# Patient Record
Sex: Female | Born: 1970 | Race: Asian | Hispanic: No | Marital: Married | Smoking: Never smoker
Health system: Southern US, Community
[De-identification: ages and names within clinical notes are randomized; demographics above are authoritative.]

## PROBLEM LIST (undated history)

## (undated) ENCOUNTER — Inpatient Hospital Stay (HOSPITAL_COMMUNITY): Payer: Self-pay

## (undated) DIAGNOSIS — F410 Panic disorder [episodic paroxysmal anxiety] without agoraphobia: Secondary | ICD-10-CM

## (undated) DIAGNOSIS — Z3480 Encounter for supervision of other normal pregnancy, unspecified trimester: Secondary | ICD-10-CM

## (undated) DIAGNOSIS — R5383 Other fatigue: Secondary | ICD-10-CM

## (undated) DIAGNOSIS — O24419 Gestational diabetes mellitus in pregnancy, unspecified control: Secondary | ICD-10-CM

## (undated) DIAGNOSIS — Z8739 Personal history of other diseases of the musculoskeletal system and connective tissue: Secondary | ICD-10-CM

## (undated) DIAGNOSIS — I499 Cardiac arrhythmia, unspecified: Secondary | ICD-10-CM

## (undated) DIAGNOSIS — K649 Unspecified hemorrhoids: Secondary | ICD-10-CM

## (undated) DIAGNOSIS — R200 Anesthesia of skin: Secondary | ICD-10-CM

## (undated) DIAGNOSIS — O24414 Gestational diabetes mellitus in pregnancy, insulin controlled: Secondary | ICD-10-CM

## (undated) DIAGNOSIS — G5600 Carpal tunnel syndrome, unspecified upper limb: Secondary | ICD-10-CM

## (undated) DIAGNOSIS — G930 Cerebral cysts: Secondary | ICD-10-CM

## (undated) DIAGNOSIS — F32A Depression, unspecified: Secondary | ICD-10-CM

## (undated) DIAGNOSIS — E7800 Pure hypercholesterolemia, unspecified: Secondary | ICD-10-CM

## (undated) DIAGNOSIS — K642 Third degree hemorrhoids: Secondary | ICD-10-CM

## (undated) DIAGNOSIS — F329 Major depressive disorder, single episode, unspecified: Secondary | ICD-10-CM

## (undated) DIAGNOSIS — I10 Essential (primary) hypertension: Secondary | ICD-10-CM

## (undated) DIAGNOSIS — Z789 Other specified health status: Secondary | ICD-10-CM

## (undated) DIAGNOSIS — J302 Other seasonal allergic rhinitis: Secondary | ICD-10-CM

## (undated) DIAGNOSIS — K219 Gastro-esophageal reflux disease without esophagitis: Secondary | ICD-10-CM

## (undated) DIAGNOSIS — Z8719 Personal history of other diseases of the digestive system: Secondary | ICD-10-CM

## (undated) DIAGNOSIS — Z860100 Personal history of colon polyps, unspecified: Secondary | ICD-10-CM

## (undated) DIAGNOSIS — G43909 Migraine, unspecified, not intractable, without status migrainosus: Secondary | ICD-10-CM

## (undated) DIAGNOSIS — R202 Paresthesia of skin: Secondary | ICD-10-CM

## (undated) DIAGNOSIS — F419 Anxiety disorder, unspecified: Secondary | ICD-10-CM

## (undated) HISTORY — PX: COLONOSCOPY W/ POLYPECTOMY: SHX1380

## (undated) HISTORY — DX: Major depressive disorder, single episode, unspecified: F32.9

## (undated) HISTORY — DX: Carpal tunnel syndrome, unspecified upper limb: G56.00

## (undated) HISTORY — PX: ESSURE TUBAL LIGATION: SUR464

## (undated) HISTORY — DX: Depression, unspecified: F32.A

## (undated) HISTORY — DX: Anxiety disorder, unspecified: F41.9

## (undated) HISTORY — DX: Unspecified hemorrhoids: K64.9

## (undated) HISTORY — DX: Cardiac arrhythmia, unspecified: I49.9

---

## 2002-05-09 ENCOUNTER — Emergency Department (HOSPITAL_COMMUNITY): Admission: EM | Admit: 2002-05-09 | Discharge: 2002-05-09 | Payer: Self-pay | Admitting: Emergency Medicine

## 2002-10-10 ENCOUNTER — Ambulatory Visit (HOSPITAL_COMMUNITY): Admission: RE | Admit: 2002-10-10 | Discharge: 2002-10-10 | Payer: Self-pay | Admitting: Obstetrics and Gynecology

## 2002-10-10 ENCOUNTER — Encounter: Payer: Self-pay | Admitting: Obstetrics and Gynecology

## 2003-02-24 ENCOUNTER — Inpatient Hospital Stay (HOSPITAL_COMMUNITY): Admission: AD | Admit: 2003-02-24 | Discharge: 2003-02-24 | Payer: Self-pay | Admitting: Obstetrics and Gynecology

## 2003-02-24 ENCOUNTER — Inpatient Hospital Stay (HOSPITAL_COMMUNITY): Admission: AD | Admit: 2003-02-24 | Discharge: 2003-02-27 | Payer: Self-pay | Admitting: *Deleted

## 2003-03-09 ENCOUNTER — Encounter: Admission: RE | Admit: 2003-03-09 | Discharge: 2003-04-08 | Payer: Self-pay | Admitting: Obstetrics and Gynecology

## 2003-04-02 ENCOUNTER — Other Ambulatory Visit: Admission: RE | Admit: 2003-04-02 | Discharge: 2003-04-02 | Payer: Self-pay | Admitting: Obstetrics and Gynecology

## 2004-05-21 ENCOUNTER — Other Ambulatory Visit: Admission: RE | Admit: 2004-05-21 | Discharge: 2004-05-21 | Payer: Self-pay | Admitting: Obstetrics and Gynecology

## 2005-04-07 ENCOUNTER — Other Ambulatory Visit: Admission: RE | Admit: 2005-04-07 | Discharge: 2005-04-07 | Payer: Self-pay | Admitting: Obstetrics and Gynecology

## 2005-10-19 ENCOUNTER — Encounter: Admission: RE | Admit: 2005-10-19 | Discharge: 2005-10-19 | Payer: Self-pay | Admitting: Obstetrics and Gynecology

## 2005-12-30 ENCOUNTER — Inpatient Hospital Stay (HOSPITAL_COMMUNITY): Admission: AD | Admit: 2005-12-30 | Discharge: 2005-12-31 | Payer: Self-pay | Admitting: Obstetrics and Gynecology

## 2005-12-31 ENCOUNTER — Inpatient Hospital Stay (HOSPITAL_COMMUNITY): Admission: AD | Admit: 2005-12-31 | Discharge: 2006-01-02 | Payer: Self-pay | Admitting: Obstetrics and Gynecology

## 2005-12-31 ENCOUNTER — Encounter (INDEPENDENT_AMBULATORY_CARE_PROVIDER_SITE_OTHER): Payer: Self-pay | Admitting: *Deleted

## 2006-08-10 ENCOUNTER — Emergency Department (HOSPITAL_COMMUNITY): Admission: EM | Admit: 2006-08-10 | Discharge: 2006-08-10 | Payer: Self-pay | Admitting: Emergency Medicine

## 2007-01-12 ENCOUNTER — Emergency Department (HOSPITAL_COMMUNITY): Admission: EM | Admit: 2007-01-12 | Discharge: 2007-01-12 | Payer: Self-pay | Admitting: Emergency Medicine

## 2007-07-05 ENCOUNTER — Emergency Department (HOSPITAL_COMMUNITY): Admission: EM | Admit: 2007-07-05 | Discharge: 2007-07-05 | Payer: Self-pay | Admitting: Family Medicine

## 2007-09-21 ENCOUNTER — Ambulatory Visit: Payer: Self-pay | Admitting: Family Medicine

## 2008-07-10 ENCOUNTER — Ambulatory Visit (HOSPITAL_COMMUNITY): Admission: RE | Admit: 2008-07-10 | Discharge: 2008-07-10 | Payer: Self-pay | Admitting: Obstetrics and Gynecology

## 2008-07-13 ENCOUNTER — Encounter (INDEPENDENT_AMBULATORY_CARE_PROVIDER_SITE_OTHER): Payer: Self-pay | Admitting: Obstetrics and Gynecology

## 2008-07-13 ENCOUNTER — Ambulatory Visit (HOSPITAL_COMMUNITY): Admission: RE | Admit: 2008-07-13 | Discharge: 2008-07-13 | Payer: Self-pay | Admitting: Obstetrics and Gynecology

## 2008-10-01 ENCOUNTER — Emergency Department (HOSPITAL_COMMUNITY): Admission: EM | Admit: 2008-10-01 | Discharge: 2008-10-01 | Payer: Self-pay | Admitting: Emergency Medicine

## 2008-10-03 ENCOUNTER — Emergency Department (HOSPITAL_COMMUNITY): Admission: EM | Admit: 2008-10-03 | Discharge: 2008-10-03 | Payer: Self-pay | Admitting: Emergency Medicine

## 2008-10-17 ENCOUNTER — Encounter: Payer: Self-pay | Admitting: Family Medicine

## 2008-10-17 ENCOUNTER — Ambulatory Visit: Payer: Self-pay | Admitting: Family Medicine

## 2008-10-17 LAB — CONVERTED CEMR LAB
ALT: 26 units/L (ref 0–35)
Albumin: 4.5 g/dL (ref 3.5–5.2)
Alkaline Phosphatase: 55 units/L (ref 39–117)
BUN: 7 mg/dL (ref 6–23)
Basophils Absolute: 0 10*3/uL (ref 0.0–0.1)
Basophils Relative: 0 % (ref 0–1)
Calcium: 8.8 mg/dL (ref 8.4–10.5)
Cholesterol: 168 mg/dL (ref 0–200)
Eosinophils Absolute: 0.2 10*3/uL (ref 0.0–0.7)
Eosinophils Relative: 2 % (ref 0–5)
Free T4: 1.08 ng/dL (ref 0.80–1.80)
Glucose, Bld: 84 mg/dL (ref 70–99)
HDL: 27 mg/dL — ABNORMAL LOW (ref >39)
Lymphocytes Relative: 35 % (ref 12–46)
Lymphs Abs: 2.9 10*3/uL (ref 0.7–4.0)
Monocytes Absolute: 0.6 10*3/uL (ref 0.1–1.0)
Neutrophils Relative %: 55 % (ref 43–77)
Triglycerides: 679 mg/dL — ABNORMAL HIGH (ref <150)
WBC: 8.1 10*3/uL (ref 4.0–10.5)

## 2008-11-14 ENCOUNTER — Ambulatory Visit: Payer: Self-pay | Admitting: Internal Medicine

## 2009-04-01 ENCOUNTER — Emergency Department (HOSPITAL_COMMUNITY): Admission: EM | Admit: 2009-04-01 | Discharge: 2009-04-01 | Payer: Self-pay | Admitting: Emergency Medicine

## 2010-02-02 NOTE — L&D Delivery Note (Signed)
Delivery Note At 7:15 PM a viable female was delivered via Vaginal, Spontaneous Delivery (Presentation: Left Occiput Anterior).  APGAR: 9, 9; weight 6 lb 5.6 oz (2880 g).   Placenta status: Intact, Spontaneous.  Cord: 3 vessels with the following complications: None.    Anesthesia: Epidural  Episiotomy: none Lacerations: 1st degree perineal Suture Repair: 3.0 vicryl rapide Est. Blood Loss (mL): 300  Mom to postpartum.  Baby to nursery-stable.   [name] [name], B+, Contra?[name] Kitchen No circ  [name] [name],[name] [name] 09/15/2010, 7:35 PM

## 2010-04-23 LAB — COMPREHENSIVE METABOLIC PANEL
Chloride: 108 mEq/L (ref 96–112)
GFR calc Af Amer: >60 mL/min (ref >60)
Glucose, Bld: 91 mg/dL (ref 70–99)
Potassium: 4 mEq/L (ref 3.5–5.1)
Sodium: 138 mEq/L (ref 135–145)
Total Bilirubin: 0.3 mg/dL (ref 0.3–1.2)
Total Protein: 8 g/dL (ref 6.0–8.3)

## 2010-04-23 LAB — PREGNANCY, URINE: Preg Test, Ur: NEGATIVE

## 2010-04-23 LAB — LIPASE, BLOOD: Lipase: 26 U/L (ref 11–59)

## 2010-04-23 LAB — CBC
HCT: 41.9 % (ref 36.0–46.0)
Hemoglobin: 14 g/dL (ref 12.0–15.0)
RDW: 13.3 % (ref 11.5–15.5)
WBC: 8.2 10*3/uL (ref 4.0–10.5)

## 2010-04-23 LAB — URINALYSIS, ROUTINE W REFLEX MICROSCOPIC
Hgb urine dipstick: NEGATIVE
Ketones, ur: NEGATIVE mg/dL
Nitrite: NEGATIVE
Urobilinogen, UA: 0.2 mg/dL (ref 0.0–1.0)

## 2010-04-23 LAB — DIFFERENTIAL
Basophils Relative: 0 % (ref 0–1)
Eosinophils Relative: 0 % (ref 0–5)
Lymphocytes Relative: 22 % (ref 12–46)
Lymphs Abs: 1.8 10*3/uL (ref 0.7–4.0)
Monocytes Absolute: 0.3 10*3/uL (ref 0.1–1.0)
Monocytes Relative: 4 % (ref 3–12)

## 2010-05-10 LAB — CULTURE, BLOOD (ROUTINE X 2)

## 2010-05-10 LAB — DIFFERENTIAL
Basophils Relative: 1 % (ref 0–1)
Eosinophils Absolute: 0 10*3/uL (ref 0.0–0.7)
Eosinophils Relative: 0 % (ref 0–5)
Lymphs Abs: 1.6 10*3/uL (ref 0.7–4.0)
Monocytes Relative: 5 % (ref 3–12)
Neutrophils Relative %: 81 % — ABNORMAL HIGH (ref 43–77)

## 2010-05-10 LAB — D-DIMER, QUANTITATIVE: D-Dimer, Quant: 0.23 ug/mL-FEU (ref 0.00–0.48)

## 2010-05-10 LAB — BASIC METABOLIC PANEL
CO2: 23 mEq/L (ref 19–32)
Chloride: 106 mEq/L (ref 96–112)
GFR calc non Af Amer: >60 mL/min (ref >60)
Potassium: 4.3 mEq/L (ref 3.5–5.1)

## 2010-05-10 LAB — CBC
Hemoglobin: 14.4 g/dL (ref 12.0–15.0)
MCHC: 34.8 g/dL (ref 30.0–36.0)
WBC: 11.3 10*3/uL — ABNORMAL HIGH (ref 4.0–10.5)

## 2010-05-12 LAB — CBC
Platelets: 245 10*3/uL (ref 150–400)
RBC: 4.6 MIL/uL (ref 3.87–5.11)
RDW: 13.4 % (ref 11.5–15.5)

## 2010-06-17 NOTE — Op Note (Signed)
NAME:  [name] [name], [name] [name]                  ACCOUNT NO.:  [id number]   MEDICAL RECORD NO.:  [id number]          PATIENT TYPE:  AMB   LOCATION:  SDC                           FACILITY:  [facility]   PHYSICIAN:  Zenaida Niece, M.D.DATE OF BIRTH:  Aug 03, 1970   DATE OF PROCEDURE:  DATE OF DISCHARGE:  07/13/2008                               OPERATIVE REPORT   PREOPERATIVE DIAGNOSIS:  Missed abortion.   POSTOPERATIVE DIAGNOSIS:  Missed abortion.   PROCEDURE:  Dilation and evacuation.   SURGEON:  Zenaida Niece, MD.   ANESTHESIA:  Monitored anesthesia care and paracervical block.   FINDINGS:  Slightly enlarged uterus and closed cervix.   SPECIMENS:  Products of conception sent for routine pathology.   ESTIMATED BLOOD LOSS:  Minimal.   COMPLICATIONS:  None.   DESCRIPTION OF PROCEDURE:  The patient was taken to the operating room  and placed in the dorsal supine position.  She was given IV sedation and  placed in mobile stirrups.  Perineum was prepped and draped in the usual  sterile fashion and bladder drained with a latex-free catheter.  Graves  speculum was inserted into the vagina and the anterior lip of the cervix  was grasped with a single-tooth tenaculum.  Paracervical block was then  performed with a total of 16 mL of 2% plain lidocaine.  Uterus then  sounded to approximately 10 cm.  Cervix was gradually dilated to a size  29 dilator.  This allowed the passage of a size 9 curved suction  curette.  Suction curettage was performed with return of blood and  products of conception.  Sharp curettage was then performed with a small  curette, revealing good uterine cry in all quadrants and no significant  tissue.  Suction curettage was performed one more time with return of no  significant blood or tissue.  The suction curette was then removed.  The  single-tooth tenaculum was removed and bleeding was controlled with  pressure.  All instruments were then removed from the vagina.  The  patient tolerated the procedure well and was taken to the recovery room  in stable condition.  Counts were correct.      Zenaida Niece, M.D.  Electronically Signed     TDM/MEDQ  D:  07/13/2008  T:  07/14/2008  Job:  [id number]

## 2010-06-20 NOTE — Discharge Summary (Signed)
NAME:  [name] [name], [name] [name]                              ACCOUNT NO.:  [id number]   MEDICAL RECORD NO.:  [id number]                   PATIENT TYPE:  INP   LOCATION:  9123                                 [facility]ILITY:  [facility]   PHYSICIAN:  Zenaida Niece, M.D.             DATE OF BIRTH:  [birth year]   DATE OF ADMISSION:  02/24/2003  DATE OF DISCHARGE:  02/27/2003                                 DISCHARGE SUMMARY   ADMISSION DIAGNOSES:  Intrauterine pregnancy at 38 weeks.   DISCHARGE DIAGNOSIS:  Intrauterine pregnancy at 38 weeks.   PROCEDURE:  On February 25, 2003 she had a spontaneous vaginal delivery.   HISTORY AND PHYSICAL:  This is a 40 year old Asian female gravida 1 para 0  with an EGA of [redacted] weeks who presents with regular contractions.  She was  seen earlier in the day on January 22 to rule out labor and was found to be  1 cm dilated.  On the second presentation in the evening of January 22 she  was 4 cm dilated.  Prenatal care complicated by Candida at 25 weeks, anemia  treated with iron, upper respiratory infection at 27 weeks treated with  Zithromax, size less than dates with an ultrasound on November 3 with  appropriate for gestational age at an estimated fetal weight of 1023 g,  repeat ultrasound on December 7 AGA approximately 34th percentile, and most  recent ultrasound on January 12 with an estimated fetal weight of 2688 g.  She has also had symptoms of carpal tunnel syndrome treated with wrist  splints and vitamin B6.  Prenatal laboratory data:  Blood type is B positive  with a negative antibody screen.  RPR nonreactive.  Rubella immune.  Hepatitis B surface antigen negative.  HIV negative.  Gonorrhea and  chlamydia negative.  One-hour Glucola 109.  Group B strep is negative.  Past  medical history:  Hypercholesterolemia and hypertriglyceridemia.  Allergies:  PENICILLIN.  Physical examination:  She was afebrile with stable vital signs.  Fetal  heart tracing reactive with initially  contractions every 3-5 minutes.  Abdomen was gravid, nontender, with an estimated fetal weight of 6 pounds.  Vaginal exam on admission was 4 cm dilated.   HOSPITAL COURSE:  The patient was admitted and had a protracted course and  was started on Pitocin.  Once she was started on Pitocin she progressed  fairly rapidly.  On my first exam she was 9, complete, and 0 with a vertex  presentation and amniotomy revealed clear fluid.  She then progressed to  complete, pushed well, and early on the morning of January 23 had a vaginal  delivery of a viable female infant with Apgars of 8 and 8 that weighed 6  pounds 15 ounces over a second degree midline episiotomy with local block.  Placenta delivered spontaneously and was intact with a three-vessel cord.  Her second degree episiotomy with partial  third degree extension was  repaired with 2-0 and 3-0 Vicryl with estimated blood loss less than 500 mL.  Postpartum she had no complications.  Predelivery hemoglobin of 14.2,  postdelivery 12.6.  She breast and bottle fed her baby and on the morning of  postpartum day #2 was stable for discharge home.   DISCHARGE INSTRUCTIONS:  1. Regular diet.  2. Pelvic rest.  3. Follow up in 6 weeks.  4. Medications:  Darvocet-N 100 #21 p.o. q.6h. p.r.n. pain and over-the-     counter ibuprofen as needed.  5. She was given our discharge pamphlet.                                               Zenaida Niece, M.D.    TDM/MEDQ  D:  02/27/2003  T:  02/27/2003  Job:  [id number]

## 2010-06-20 NOTE — Discharge Summary (Signed)
NAME:  [name], [name]                  ACCOUNT NO.:  [id number]   MEDICAL RECORD NO.:  [id number]          PATIENT TYPE:  INP   LOCATION:  9146                          [facility]ILITY[facility]   PHYSICIAN:  [name] [name], M.D.DATE OF BIRTH:  [birth year]   DATE OF ADMISSION:  12/31/2005  DATE OF DISCHARGE:  01/02/2006                               DISCHARGE SUMMARY   ADMISSION DIAGNOSIS:  Intrauterine pregnancy at 38 weeks and gestational  diabetes.   DISCHARGE DIAGNOSIS:  Intrauterine pregnancy at 38 weeks and gestational  diabetes and postpartum hemorrhage.   PROCEDURES:  On November 29, she had a spontaneous vaginal delivery.   HISTORY AND PHYSICAL:  This is a 40 year old Asian female gravida 2,  para 1-0-0-1 with an EGA of [redacted] weeks who presents with complaint of  regular contractions.  Evaluation in the office revealed cervix to be 3-  4 cm dilated.  Prenatal care complicated by gestational diabetes that  was fairly well controlled with glyburide.  She also had size less than  dates with normal growth by ultrasound.  She was also treated with  Zithromax in [place] a URI.   PRENATAL LABS:  Blood type is B+ with a negative antibody screen, RPR  nonreactive, rubella immune, hepatitis B surface antigen negative,  gonorrhea and chlamydia negative, 1-hour Glucola 182, 3-hour GTT 88,  212, 219 and 187.   PAST OB HISTORY:  One vaginal delivery at term without complications.   ALLERGIES:  To PENICILLIN which causes a rash.   PHYSICAL EXAM:  VITAL SIGNS:  She was afebrile with stable vital signs.  ABDOMEN:  Abdomen was gravid, soft with a fundal height of 35.5 cm.  Fetal heart tracing was normal.  PELVIC:  Cervix on Dr. Ebony [name] first exam was 3 to 4, 80-90, -1 to 0,  and amniotomy revealed clear fluid.  Glucose on admission was 108.   HOSPITAL COURSE:  The patient was admitted and Dr. Ambrose [name] performed  amniotomy for augmentation.  She progressed to complete, pushed well and  on the  evening of November29 had a vaginal delivery of a viable female  infant with Apgars of 9 and 9, weight 7 pounds 12 ounces.  Placenta was  large and intact and uterus palpated normal.  She had lacerations  between the clitoris and urethra and a second-degree perineal laceration  and these were repaired with 3-0 Vicryl with local block.  Estimated  blood loss was 500 mL.  Postpartum course complicated by postpartum  hemorrhage where she passed approximately 400 mL of clots.  She was  given Hemabate twice and then remained stable.  Pre delivery hemoglobin  was 13.7, 9.7 at the time of her hemorrhage, and 8.6 later that morning.  She had no further significant bleeding and on morning postpartum day #2  was felt to be stable for discharge home.   DISCHARGE INSTRUCTIONS:  Regular diet, pelvic rest, follow-up in 6  weeks.   DISCHARGE MEDICATIONS:  1. Over-the-counter ibuprofen as needed.  2. She is to continue her prenatal vitamins and iron.   She is given our discharge  pamphlet.      [name] [name], M.D.  Electronically Signed     TDM/MEDQ  D:  01/02/2006  T:  01/03/2006  Job:  [id number]

## 2010-07-08 ENCOUNTER — Other Ambulatory Visit (HOSPITAL_COMMUNITY): Payer: Self-pay | Admitting: Obstetrics and Gynecology

## 2010-07-09 ENCOUNTER — Ambulatory Visit (HOSPITAL_COMMUNITY)
Admission: RE | Admit: 2010-07-09 | Discharge: 2010-07-09 | Disposition: A | Discharge status: Home / Self Care | Payer: Medicaid Other | Source: Ambulatory Visit | Attending: Obstetrics and Gynecology | Admitting: Obstetrics and Gynecology

## 2010-07-09 DIAGNOSIS — Z3689 Encounter for other specified antenatal screening: Secondary | ICD-10-CM | POA: Insufficient documentation

## 2010-07-09 DIAGNOSIS — O365990 Maternal care for other known or suspected poor fetal growth, unspecified trimester, not applicable or unspecified: Secondary | ICD-10-CM | POA: Insufficient documentation

## 2010-07-30 ENCOUNTER — Encounter: Payer: Medicaid Other | Attending: Obstetrics and Gynecology

## 2010-07-30 DIAGNOSIS — O99810 Abnormal glucose complicating pregnancy: Secondary | ICD-10-CM | POA: Insufficient documentation

## 2010-07-30 DIAGNOSIS — Z713 Dietary counseling and surveillance: Secondary | ICD-10-CM | POA: Insufficient documentation

## 2010-08-20 ENCOUNTER — Inpatient Hospital Stay (HOSPITAL_COMMUNITY)
Admission: AD | Admit: 2010-08-20 | Discharge: 2010-08-20 | Disposition: A | Discharge status: Home / Self Care | Payer: Medicaid Other | Source: Ambulatory Visit | Attending: Obstetrics and Gynecology | Admitting: Obstetrics and Gynecology

## 2010-08-20 ENCOUNTER — Other Ambulatory Visit (HOSPITAL_COMMUNITY): Payer: Self-pay | Admitting: Obstetrics and Gynecology

## 2010-08-20 ENCOUNTER — Encounter (HOSPITAL_COMMUNITY): Payer: Self-pay | Admitting: Obstetrics and Gynecology

## 2010-08-20 DIAGNOSIS — Z3689 Encounter for other specified antenatal screening: Secondary | ICD-10-CM

## 2010-08-20 NOTE — Progress Notes (Signed)
Pt states she was seen in the office today for reg office visit, the office told her they did not have time to watch baby on the monitor and told her she needed to come to [facility] for NST.

## 2010-08-21 ENCOUNTER — Ambulatory Visit (HOSPITAL_COMMUNITY): Payer: Medicaid Other

## 2010-08-21 ENCOUNTER — Other Ambulatory Visit (HOSPITAL_COMMUNITY): Payer: Self-pay | Admitting: Obstetrics and Gynecology

## 2010-08-21 ENCOUNTER — Ambulatory Visit (HOSPITAL_COMMUNITY)
Admit: 2010-08-21 | Discharge: 2010-08-21 | Disposition: A | Discharge status: Home / Self Care | Payer: Medicaid Other | Attending: Obstetrics and Gynecology | Admitting: Obstetrics and Gynecology

## 2010-08-21 ENCOUNTER — Other Ambulatory Visit (HOSPITAL_COMMUNITY): Payer: Medicaid Other

## 2010-08-21 DIAGNOSIS — Z3689 Encounter for other specified antenatal screening: Secondary | ICD-10-CM

## 2010-08-21 DIAGNOSIS — O99810 Abnormal glucose complicating pregnancy: Secondary | ICD-10-CM | POA: Insufficient documentation

## 2010-08-21 DIAGNOSIS — O365990 Maternal care for other known or suspected poor fetal growth, unspecified trimester, not applicable or unspecified: Secondary | ICD-10-CM | POA: Insufficient documentation

## 2010-08-21 DIAGNOSIS — O09529 Supervision of elderly multigravida, unspecified trimester: Secondary | ICD-10-CM | POA: Insufficient documentation

## 2010-09-10 ENCOUNTER — Inpatient Hospital Stay (HOSPITAL_COMMUNITY)
Admission: AD | Admit: 2010-09-10 | Discharge: 2010-09-10 | Disposition: A | Discharge status: Home / Self Care | Payer: Medicaid Other | Source: Ambulatory Visit | Attending: Obstetrics and Gynecology | Admitting: Obstetrics and Gynecology

## 2010-09-10 ENCOUNTER — Inpatient Hospital Stay (HOSPITAL_COMMUNITY): Payer: Medicaid Other

## 2010-09-10 DIAGNOSIS — O368390 Maternal care for abnormalities of the fetal heart rate or rhythm, unspecified trimester, not applicable or unspecified: Secondary | ICD-10-CM | POA: Insufficient documentation

## 2010-09-10 NOTE — Progress Notes (Signed)
Patient sent by Dr. Ambrose Mantle for outpatient NST only, no efm.

## 2010-09-15 ENCOUNTER — Encounter (HOSPITAL_COMMUNITY): Payer: Self-pay | Admitting: *Deleted

## 2010-09-15 ENCOUNTER — Encounter (HOSPITAL_COMMUNITY): Payer: Self-pay | Admitting: Anesthesiology

## 2010-09-15 ENCOUNTER — Encounter (HOSPITAL_COMMUNITY): Payer: Self-pay | Admitting: Obstetrics and Gynecology

## 2010-09-15 ENCOUNTER — Other Ambulatory Visit: Payer: Self-pay | Admitting: Obstetrics and Gynecology

## 2010-09-15 ENCOUNTER — Inpatient Hospital Stay (HOSPITAL_COMMUNITY): Payer: Medicaid Other | Admitting: Anesthesiology

## 2010-09-15 ENCOUNTER — Inpatient Hospital Stay (HOSPITAL_COMMUNITY)
Admission: AD | Admit: 2010-09-15 | Discharge: 2010-09-17 | DRG: 774 | Disposition: A | Discharge status: Home / Self Care | Payer: Medicaid Other | Source: Ambulatory Visit | Attending: Obstetrics and Gynecology | Admitting: Obstetrics and Gynecology

## 2010-09-15 DIAGNOSIS — IMO0002 Reserved for concepts with insufficient information to code with codable children: Principal | ICD-10-CM | POA: Diagnosis present

## 2010-09-15 DIAGNOSIS — O99814 Abnormal glucose complicating childbirth: Secondary | ICD-10-CM | POA: Diagnosis present

## 2010-09-15 DIAGNOSIS — Z3480 Encounter for supervision of other normal pregnancy, unspecified trimester: Secondary | ICD-10-CM

## 2010-09-15 DIAGNOSIS — O09529 Supervision of elderly multigravida, unspecified trimester: Secondary | ICD-10-CM | POA: Diagnosis present

## 2010-09-15 DIAGNOSIS — O1490 Unspecified pre-eclampsia, unspecified trimester: Secondary | ICD-10-CM

## 2010-09-15 HISTORY — DX: Encounter for supervision of other normal pregnancy, unspecified trimester: Z34.80

## 2010-09-15 HISTORY — DX: Other specified health status: Z78.9

## 2010-09-15 HISTORY — DX: Pure hypercholesterolemia, unspecified: E78.00

## 2010-09-15 LAB — URIC ACID: Uric Acid, Serum: 7.1 mg/dL — ABNORMAL HIGH (ref 2.4–7.0)

## 2010-09-15 LAB — COMPREHENSIVE METABOLIC PANEL
ALT: 15 U/L (ref 0–35)
AST: 22 U/L (ref 0–37)
Calcium: 8.7 mg/dL (ref 8.4–10.5)
Sodium: 137 mEq/L (ref 135–145)
Total Protein: 6.2 g/dL (ref 6.0–8.3)

## 2010-09-15 LAB — GLUCOSE, CAPILLARY
Glucose-Capillary: 88 mg/dL (ref 70–99)
Glucose-Capillary: 68 mg/dL — ABNORMAL LOW (ref 70–99)
Glucose-Capillary: 81 mg/dL (ref 70–99)

## 2010-09-15 LAB — CBC
HCT: 40.2 % (ref 36.0–46.0)
Hemoglobin: 13.4 g/dL (ref 12.0–15.0)
MCH: 30.1 pg (ref 26.0–34.0)
MCH: 29.8 pg (ref 26.0–34.0)
MCHC: 33.3 g/dL (ref 30.0–36.0)
MCHC: 33.3 g/dL (ref 30.0–36.0)
MCV: 89.3 fL (ref 78.0–100.0)
Platelets: 185 10*3/uL (ref 150–400)
Platelets: 184 K/uL (ref 150–400)
RBC: 4.55 MIL/uL (ref 3.87–5.11)
RBC: 4.5 MIL/uL (ref 3.87–5.11)
RDW: 14.2 % (ref 11.5–15.5)
WBC: 22.4 K/uL — ABNORMAL HIGH (ref 4.0–10.5)

## 2010-09-15 LAB — RUBELLA ANTIBODY, IGM: Rubella: IMMUNE

## 2010-09-15 LAB — URINALYSIS, DIPSTICK ONLY
Bilirubin Urine: NEGATIVE
Glucose, UA: NEGATIVE mg/dL
Ketones, ur: NEGATIVE mg/dL
Leukocytes, UA: NEGATIVE
Nitrite: NEGATIVE
Protein, ur: 100 mg/dL — AB
Specific Gravity, Urine: 1.025 (ref 1.005–1.030)
Urobilinogen, UA: 0.2 mg/dL (ref 0.0–1.0)
pH: 6 (ref 5.0–8.0)

## 2010-09-15 LAB — HEPATITIS B SURFACE ANTIGEN: Hepatitis B Surface Ag: NEGATIVE

## 2010-09-15 LAB — STREP B DNA PROBE: GBS: POSITIVE

## 2010-09-15 LAB — RPR: RPR: NONREACTIVE

## 2010-09-15 LAB — HIV ANTIBODY (ROUTINE TESTING W REFLEX): HIV: NONREACTIVE

## 2010-09-15 MED ORDER — EPHEDRINE 5 MG/ML INJ: 10.0000 mg | INTRAVENOUS | Status: DC | PRN

## 2010-09-15 MED ORDER — MAGNESIUM SULFATE BOLUS VIA INFUSION
4.0000 g | Freq: Once | INTRAVENOUS | Status: AC
  Administered 2010-09-15: 4 g via INTRAVENOUS
  Filled 2010-09-15: qty 500

## 2010-09-15 MED ORDER — MAGNESIUM SULFATE 40 G IN LACTATED RINGERS - SIMPLE
2.0000 g/h | INTRAVENOUS | Status: DC
  Administered 2010-09-15: 4 g/h via INTRAVENOUS
  Administered 2010-09-16: 2 g/h via INTRAVENOUS
  Filled 2010-09-15 (×2): qty 500

## 2010-09-15 MED ORDER — IBUPROFEN 600 MG PO TABS
600.0000 mg | ORAL_TABLET | Freq: Four times a day (QID) | ORAL | Status: DC
  Administered 2010-09-16 – 2010-09-17 (×3): 600 mg via ORAL
  Filled 2010-09-15 (×4): qty 1

## 2010-09-15 MED ORDER — SIMETHICONE 80 MG PO CHEW
80.0000 mg | CHEWABLE_TABLET | ORAL | Status: DC | PRN
  Administered 2010-09-16: 80 mg via ORAL

## 2010-09-15 MED ORDER — DIPHENHYDRAMINE HCL 50 MG/ML IJ SOLN: 12.5000 mg | INTRAMUSCULAR | Status: DC | PRN

## 2010-09-15 MED ORDER — CITRIC ACID-SODIUM CITRATE 334-500 MG/5ML PO SOLN: 30.0000 mL | ORAL | Status: DC | PRN

## 2010-09-15 MED ORDER — EPHEDRINE 5 MG/ML INJ
10.0000 mg | INTRAVENOUS | Status: DC | PRN
  Filled 2010-09-15: qty 4

## 2010-09-15 MED ORDER — ONDANSETRON HCL 4 MG PO TABS: 4.0000 mg | ORAL_TABLET | ORAL | Status: DC | PRN

## 2010-09-15 MED ORDER — FENTANYL 2.5 MCG/ML BUPIVACAINE 1/10 % EPIDURAL INFUSION (WH - ANES)
14.0000 mL/h | INTRAMUSCULAR | Status: DC
  Filled 2010-09-15: qty 60

## 2010-09-15 MED ORDER — LACTATED RINGERS IV SOLN
500.0000 mL | Freq: Once | INTRAVENOUS | Status: AC
  Administered 2010-09-15: 1000 mL via INTRAVENOUS

## 2010-09-15 MED ORDER — LIDOCAINE HCL 1.5 % IJ SOLN
INTRAMUSCULAR | Status: DC | PRN
  Administered 2010-09-15 (×2): 5 mL via EPIDURAL

## 2010-09-15 MED ORDER — CEFAZOLIN SODIUM-DEXTROSE 2-3 GM-% IV SOLR
2.0000 g | Freq: Once | INTRAVENOUS | Status: AC
  Administered 2010-09-15: 2 g via INTRAVENOUS
  Filled 2010-09-15: qty 50

## 2010-09-15 MED ORDER — OXYTOCIN 20 UNITS IN LACTATED RINGERS INFUSION - SIMPLE
125.0000 mL/h | INTRAVENOUS | Status: DC | PRN
  Filled 2010-09-15: qty 1000

## 2010-09-15 MED ORDER — ZOLPIDEM TARTRATE 5 MG PO TABS: 5.0000 mg | ORAL_TABLET | Freq: Every evening | ORAL | Status: DC | PRN

## 2010-09-15 MED ORDER — LANOLIN HYDROUS EX OINT: TOPICAL_OINTMENT | CUTANEOUS | Status: DC | PRN

## 2010-09-15 MED ORDER — IBUPROFEN 600 MG PO TABS: 600.0000 mg | ORAL_TABLET | Freq: Four times a day (QID) | ORAL | Status: DC | PRN

## 2010-09-15 MED ORDER — DEXTROSE IN LACTATED RINGERS 5 % IV SOLN
INTRAVENOUS | Status: DC
  Administered 2010-09-15: 125 mL/h via INTRAVENOUS

## 2010-09-15 MED ORDER — DIPHENHYDRAMINE HCL 25 MG PO CAPS: 25.0000 mg | ORAL_CAPSULE | Freq: Four times a day (QID) | ORAL | Status: DC | PRN

## 2010-09-15 MED ORDER — SODIUM CHLORIDE 0.9 % IV SOLN: 250.0000 mL | INTRAVENOUS | Status: DC

## 2010-09-15 MED ORDER — PHENYLEPHRINE 40 MCG/ML (10ML) SYRINGE FOR IV PUSH (FOR BLOOD PRESSURE SUPPORT)
80.0000 ug | PREFILLED_SYRINGE | INTRAVENOUS | Status: DC | PRN
  Filled 2010-09-15: qty 5

## 2010-09-15 MED ORDER — OXYCODONE-ACETAMINOPHEN 5-325 MG PO TABS: 2.0000 | ORAL_TABLET | ORAL | Status: DC | PRN

## 2010-09-15 MED ORDER — OXYTOCIN 20 UNITS IN LACTATED RINGERS INFUSION - SIMPLE
1.0000 m[IU]/min | INTRAVENOUS | Status: DC
  Administered 2010-09-15: 1 m[IU]/min via INTRAVENOUS
  Filled 2010-09-15: qty 1000

## 2010-09-15 MED ORDER — LACTATED RINGERS IV SOLN
INTRAVENOUS | Status: DC
  Administered 2010-09-16 (×2): via INTRAVENOUS

## 2010-09-15 MED ORDER — CEFAZOLIN SODIUM 1-5 GM-% IV SOLN
1.0000 g | Freq: Three times a day (TID) | INTRAVENOUS | Status: DC
  Filled 2010-09-15: qty 50

## 2010-09-15 MED ORDER — MAGNESIUM SULFATE 40 G IN LACTATED RINGERS - SIMPLE
2.0000 g/h | INTRAVENOUS | Status: DC
  Filled 2010-09-15: qty 500

## 2010-09-15 MED ORDER — DIBUCAINE 1 % RE OINT
1.0000 "application " | TOPICAL_OINTMENT | RECTAL | Status: DC | PRN
  Filled 2010-09-15: qty 28

## 2010-09-15 MED ORDER — SODIUM CHLORIDE 0.9 % IJ SOLN: 3.0000 mL | Freq: Two times a day (BID) | INTRAMUSCULAR | Status: DC

## 2010-09-15 MED ORDER — BENZOCAINE-MENTHOL 20-0.5 % EX AERO
1.0000 "application " | INHALATION_SPRAY | CUTANEOUS | Status: DC | PRN
  Administered 2010-09-16: 1 via TOPICAL
  Filled 2010-09-15 (×2): qty 56

## 2010-09-15 MED ORDER — ACETAMINOPHEN 325 MG PO TABS
650.0000 mg | ORAL_TABLET | ORAL | Status: DC | PRN
  Administered 2010-09-15: 650 mg via ORAL
  Filled 2010-09-15: qty 2

## 2010-09-15 MED ORDER — PRENATAL PLUS 27-1 MG PO TABS
1.0000 | ORAL_TABLET | Freq: Every day | ORAL | Status: DC
  Administered 2010-09-16 – 2010-09-17 (×2): 1 via ORAL
  Filled 2010-09-15 (×2): qty 1

## 2010-09-15 MED ORDER — WITCH HAZEL-GLYCERIN EX PADS: 1.0000 "application " | MEDICATED_PAD | CUTANEOUS | Status: DC | PRN

## 2010-09-15 MED ORDER — OXYTOCIN BOLUS FROM INFUSION
500.0000 mL | Freq: Once | INTRAVENOUS | Status: DC
  Filled 2010-09-15: qty 500

## 2010-09-15 MED ORDER — SODIUM CHLORIDE 0.9 % IJ SOLN
3.0000 mL | INTRAMUSCULAR | Status: DC | PRN
  Administered 2010-09-16: 3 mL via INTRAVENOUS

## 2010-09-15 MED ORDER — PRENATAL PLUS 27-1 MG PO TABS: 1.0000 | ORAL_TABLET | Freq: Every day | ORAL | Status: DC

## 2010-09-15 MED ORDER — TERBUTALINE SULFATE 1 MG/ML IJ SOLN: 0.2500 mg | Freq: Once | INTRAMUSCULAR | Status: DC | PRN

## 2010-09-15 MED ORDER — BUTORPHANOL TARTRATE 2 MG/ML IJ SOLN
INTRAMUSCULAR | Status: AC
  Filled 2010-09-15: qty 1

## 2010-09-15 MED ORDER — FENTANYL 2.5 MCG/ML BUPIVACAINE 1/10 % EPIDURAL INFUSION (WH - ANES)
INTRAMUSCULAR | Status: DC | PRN
  Administered 2010-09-15: 14 mL/h via EPIDURAL

## 2010-09-15 MED ORDER — LIDOCAINE HCL (PF) 1 % IJ SOLN: 30.0000 mL | INTRAMUSCULAR | Status: DC | PRN

## 2010-09-15 MED ORDER — ONDANSETRON HCL 4 MG/2ML IJ SOLN: 4.0000 mg | Freq: Four times a day (QID) | INTRAMUSCULAR | Status: DC | PRN

## 2010-09-15 MED ORDER — ONDANSETRON HCL 4 MG/2ML IJ SOLN
4.0000 mg | INTRAMUSCULAR | Status: DC | PRN
  Administered 2010-09-15: 4 mg via INTRAVENOUS
  Filled 2010-09-15: qty 2

## 2010-09-15 MED ORDER — SENNOSIDES-DOCUSATE SODIUM 8.6-50 MG PO TABS
2.0000 | ORAL_TABLET | Freq: Every day | ORAL | Status: DC
  Administered 2010-09-16: 2 via ORAL

## 2010-09-15 MED ORDER — OXYCODONE-ACETAMINOPHEN 5-325 MG PO TABS: 1.0000 | ORAL_TABLET | ORAL | Status: DC | PRN

## 2010-09-15 MED ORDER — OXYTOCIN 20 UNITS IN LACTATED RINGERS INFUSION - SIMPLE: 125.0000 mL/h | INTRAVENOUS | Status: DC

## 2010-09-15 MED ORDER — LACTATED RINGERS IV SOLN
INTRAVENOUS | Status: DC
  Administered 2010-09-15: 125 mL/h via INTRAVENOUS

## 2010-09-15 MED ORDER — BUTORPHANOL TARTRATE 2 MG/ML IJ SOLN: 2.0000 mg | INTRAMUSCULAR | Status: DC | PRN

## 2010-09-15 MED ORDER — TETANUS-DIPHTH-ACELL PERTUSSIS 5-2.5-18.5 LF-MCG/0.5 IM SUSP
0.5000 mL | Freq: Once | INTRAMUSCULAR | Status: AC
  Administered 2010-09-16: 0.5 mL via INTRAMUSCULAR
  Filled 2010-09-15: qty 0.5

## 2010-09-15 MED ORDER — LACTATED RINGERS IV SOLN: 500.0000 mL | INTRAVENOUS | Status: DC | PRN

## 2010-09-15 MED ORDER — PHENYLEPHRINE 40 MCG/ML (10ML) SYRINGE FOR IV PUSH (FOR BLOOD PRESSURE SUPPORT): 80.0000 ug | PREFILLED_SYRINGE | INTRAVENOUS | Status: DC | PRN

## 2010-09-15 NOTE — H&P (Signed)
[name], [name]                  ACCOUNT NO.:  [id number]  MEDICAL RECORD NO.:  [id number]  LOCATION:  9175                          [facility]ILITY:  [facility]  PHYSICIAN:  Zenaida Niece, M.D.DATE OF BIRTH:  Aug 17, 1970  DATE OF ADMISSION:  09/15/2010 DATE OF DISCHARGE:                             HISTORY & PHYSICAL   CHIEF COMPLAINT:  Gestational hypertension and gestational diabetes and possible early labor.  HISTORY OF PRESENT ILLNESS:  This is a 40 year old gravida 4, para 2-0-1- 1 with an EGA of [redacted] weeks and 4 days by an LMP consistent with early ultrasound with a due date of August 23rd who presents to the office today for routine exam.  She has recently had elevated blood pressures 150-160 over 90-100.  She brought a 24-hour urine in today.  On evaluation today, she has 3+ proteinuria and blood pressure is 160/108. She is having some contractions and cervix is 150 and -2.  Due to this elevated blood pressure and her advanced gestational age I do not see any benefit in prolong in a pregnancy, so she is being admitted for labor augmentation as she is having some contractions.  Prenatal care has been complicated by gestational diabetes which had been controlled with diet, but has recently been controlled with 2.5 mg of glyburide in the morning.  She also had anxiety treated with Prozac due to the fact that her last child died at 24 months of age with asphyxia.  Please see prenatal records for full history.  Prenatal care, she has measured small but ultrasounds have confirmed appropriate for gestational age the size of normal amniotic fluid volume.  Prenatal labs:  Significant labs, blood type is B+ with a negative antibody screen.  Rubella immune, hepatitis B surface antigen negative, HIV negative.  Cystic fibrosis is negative.  Genetic screening was declined.  GC T was 177, GTT was 83, 242, 190 and 170.  Group B strep is positive.  PAST OB HISTORY:  In 2005, she had a vaginal  delivery at 38 weeks.  Baby weighed 6 pounds 15 ounces.  No complications.  In 2007, vaginal delivery at 38 weeks, 7 pounds 12 ounces complicated by gestational diabetes and she did have postpartum hemorrhage.  The baby died 6-9 months after delivery with asphyxia.  She does have one spontaneous abortion in 2010.  PAST MEDICAL HISTORY:  Anxiety, depression and high cholesterol and elevated triglycerides.  PAST SURGICAL HISTORY:  D and C.  ALLERGIES:  She gets a rash with PENICILLIN.  MEDICATIONS:  She has been on Prozac.  Glyburide 2.5 mg p.o. q.a.m.  FAMILY HISTORY:  No GYN or colon cancer.  SOCIAL HISTORY:  She is single but involved in a stable relationship. She denies alcohol, tobacco or drug use.  REVIEW OF SYSTEMS:  Normal complaints of pregnancy.  PHYSICAL EXAM:  GENERAL:  This is a well-developed female in no acute distress. VITAL SIGNS:  Blood pressure again is 160/108. NECK:  Supple without lymphadenopathy or thyromegaly. LUNGS:  Clear to auscultation. HEART:  Regular rate and rhythm without murmur. ABDOMEN:  Gravid, nontender.  She does measure on the small side. EXTREMITIES:  Have 1+ edema and  are nontender.  Cervix is 150, -2, vertex presentation.  ASSESSMENT:  Intrauterine pregnancy at 38+ weeks with gestational hypertension and gestational diabetes as well as possible early labor. PIH labs have been normal, but blood pressures remain elevated.  Blood sugars have been well controlled on glyburide 2.5 mg p.o. q.a.m.  PLAN:  Admit the patient for augmentation with Pitocin.  She also be started on Ancef for group B strep prophylaxis.  We will check PIH labs and monitor her blood pressure carefully.  We will monitor CBGs to see if she needs any treatment for her sugars.     Zenaida Niece, M.D.     TDM/MEDQ  D:  09/15/2010  T:  09/15/2010  Job:  [id number]

## 2010-09-15 NOTE — Progress Notes (Signed)
Provider asked if epidural catheter can be removed, cbc to be done first

## 2010-09-15 NOTE — Progress Notes (Signed)
[name] [name] is a 40 y.o. O1H0865 at [redacted]w[redacted]d  admitted for induction of labor due to Hypertension.  Subjective: comf with epidural  Objective: BP 164/84  Pulse 83  Temp(Src) 98.3 F (36.8 C) (Oral)  Resp 13  Ht 5' (1.524 m)  Wt 61.689 kg (136 lb)  BMI 26.56 kg/m2  BP 150-195/85-110, since epidural 160/90   gen NAD FHT:  FHR: 150 bpm, variability: minimal ,  accelerations:  Present with scalp stim,  decelerations:  Present variables UC:   regular, every 3 minutes SVE:   Dilation:5.6  Effacement (%): 90 Station: 0 Exam by:: Carmelina Noun, MD AROM for clear fluid, copious. W/o diff/comp  Labs: Lab Results  Component Value Date   WBC 8.4 09/15/2010   HGB 13.7 09/15/2010   HCT 41.2 09/15/2010   MCV 90.5 09/15/2010   PLT 185 09/15/2010    Assessment / Plan: Induction of labor due to gestational hypertension,  progressing well on pitocin  Labor: Progressing normally, AROM Preeclampsia:  will monitor closely for sx's, labs stable, no symptoms; t/c magnesium sulfate Fetal Wellbeing:  Category II Pain Control:  Epidural  Anticipated MOD:  NSVD  BOVARD,[name] [name] 09/15/2010, 5:40 PM

## 2010-09-15 NOTE — Anesthesia Preprocedure Evaluation (Signed)
Anesthesia Evaluation  Name, MR# and DOB Patient awake  General Assessment Comment  Reviewed: Allergy & Precautions, H&P , Patient's Chart, lab work & pertinent test results  Airway Mallampati: II TM Distance: >3 FB Neck ROM: full    Dental No notable dental hx.    Pulmonary  clear to auscultation  pulmonary exam normalPulmonary Exam Normal breath sounds clear to auscultation none    Cardiovascular hypertension, regular Normal    Neuro/Psych Negative Neurological ROS  Negative Psych ROS  GI/Hepatic/Renal negative GI ROS, negative Liver ROS, and negative Renal ROS (+)       Endo/Other  (+) Gestational,     Abdominal   Musculoskeletal   Hematology negative hematology ROS (+)   Peds  Reproductive/Obstetrics (+) Pregnancy    Anesthesia Other Findings             Anesthesia Physical Anesthesia Plan  ASA: II  Anesthesia Plan: Epidural   Post-op Pain Management:    Induction:   Airway Management Planned:   Additional Equipment:   Intra-op Plan:   Post-operative Plan:   Informed Consent: I have reviewed the patients History and Physical, chart, labs and discussed the procedure including the risks, benefits and alternatives for the proposed anesthesia with the patient or authorized representative who has indicated his/her understanding and acceptance.     Plan Discussed with:   Anesthesia Plan Comments:         Anesthesia Quick Evaluation

## 2010-09-15 NOTE — Progress Notes (Signed)
09/15/10 1928  Infant Feeding  Breastfeeding delayed due to: Other (comment)  importance of early breastfeeding explained to pt.

## 2010-09-15 NOTE — Anesthesia Postprocedure Evaluation (Signed)
Anesthesia Post Note  Patient: [name] [name]  Procedure(s) Performed: * No procedures listed *  Anesthesia type: Epidural  Patient location: Mother/Baby  Post pain: Pain level controlled  Post assessment: Post-op Vital signs reviewed  Last Vitals:  Filed Vitals:   09/15/10 2016  BP: 169/93  Pulse: 113  Temp:   Resp:     Post vital signs: Reviewed  Level of consciousness: awake  Complications: No apparent anesthesia complications

## 2010-09-15 NOTE — Anesthesia Procedure Notes (Signed)
Epidural Patient location during procedure: OB Start time: 09/15/2010 4:43 PM End time: 09/15/2010 4:50 PM Reason for block: procedure for pain  Staffing Anesthesiologist: Sandrea Hughs Performed by: anesthesiologist   Preanesthetic Checklist Completed: patient identified, site marked, surgical consent, pre-op evaluation, timeout performed, IV checked, risks and benefits discussed and monitors and equipment checked  Epidural Patient position: sitting Prep: site prepped and draped and DuraPrep Patient monitoring: continuous pulse ox and blood pressure Approach: midline Injection technique: LOR air  Needle:  Needle type: Tuohy  Needle gauge: 17 G Needle length: 9 cm Needle insertion depth: 5 cm cm Catheter type: closed end flexible Catheter size: 19 Gauge Catheter at skin depth: 10 cm Test dose: negative and 1.5% lidocaine  Assessment Sensory level: T8 Events: blood not aspirated, injection not painful, no injection resistance, negative IV test and no paresthesia

## 2010-09-15 NOTE — Progress Notes (Signed)
09/15/10 2256  Provider Notification  Provider Name/Title Ellison Hughs, MD  Method of Notification Phone  Notification Reason Lab/diagnostic study results  Provider ok to DC epidural catheter

## 2010-09-16 DIAGNOSIS — O1490 Unspecified pre-eclampsia, unspecified trimester: Secondary | ICD-10-CM

## 2010-09-16 LAB — CBC
HCT: 35.1 % — ABNORMAL LOW (ref 36.0–46.0)
Hemoglobin: 11.7 g/dL — ABNORMAL LOW (ref 12.0–15.0)
MCV: 89.5 fL (ref 78.0–100.0)
RBC: 3.92 MIL/uL (ref 3.87–5.11)
WBC: 21.6 10*3/uL — ABNORMAL HIGH (ref 4.0–10.5)

## 2010-09-16 LAB — URIC ACID: Uric Acid, Serum: 7.4 mg/dL — ABNORMAL HIGH (ref 2.4–7.0)

## 2010-09-16 LAB — COMPREHENSIVE METABOLIC PANEL
Albumin: 2.1 g/dL — ABNORMAL LOW (ref 3.5–5.2)
Alkaline Phosphatase: 134 U/L — ABNORMAL HIGH (ref 39–117)
BUN: 11 mg/dL (ref 6–23)
CO2: 23 mEq/L (ref 19–32)
Chloride: 99 mEq/L (ref 96–112)
Creatinine, Ser: 0.65 mg/dL (ref 0.50–1.10)
GFR calc Af Amer: >60 mL/min (ref >60)
GFR calc non Af Amer: >60 mL/min (ref >60)
Glucose, Bld: 84 mg/dL (ref 70–99)
Potassium: 4.4 mEq/L (ref 3.5–5.1)
Total Bilirubin: 0.2 mg/dL — ABNORMAL LOW (ref 0.3–1.2)

## 2010-09-16 LAB — LACTATE DEHYDROGENASE: LDH: 239 U/L (ref 94–250)

## 2010-09-16 LAB — GLUCOSE, CAPILLARY: Glucose-Capillary: 125 mg/dL — ABNORMAL HIGH (ref 70–99)

## 2010-09-16 MED ORDER — PANTOPRAZOLE SODIUM 40 MG PO TBEC
40.0000 mg | DELAYED_RELEASE_TABLET | Freq: Every day | ORAL | Status: DC
  Administered 2010-09-16: 40 mg via ORAL
  Filled 2010-09-16 (×2): qty 1

## 2010-09-16 NOTE — Progress Notes (Signed)
Transferred via wheelchair to [facility]  [facility].  Ambulating well, steady gait.  No c/o pain  on transfer.  Acc by family.

## 2010-09-16 NOTE — Progress Notes (Deleted)
Post Partum Day 1 Subjective: no complaints, up ad lib, voiding, tolerating PO and currently breast feeding.   Objective: Blood pressure 144/90, pulse 77, temperature 97.5 F (36.4 C), temperature source Oral, resp. rate 18, height 5' (1.524 m), weight 61.689 kg (136 lb), unknown if currently breastfeeding.  Physical Exam:  General: alert, cooperative and no distress Lochia: appropriate Uterine Fundus: firm, below umbilicus Abd: Soft, NT, +BS Cardio: RRR, no murmurs Lungs: CTA, no wheezes or crackles DTR: +2 DVT Evaluation: No evidence of DVT seen on physical exam. Negative Homan's sign. No cords or calf tenderness.   Basename 09/16/10 0500 09/15/10 2235  HGB 11.7* 13.4  HCT 35.1* 40.2    Assessment/Plan: Breastfeeding and Contraception Condoms. Continue care, consider discontinuing Mag at approximately 1900.   LOS: 1 day   Katie Durland 09/16/2010, 7:27 AM

## 2010-09-16 NOTE — Progress Notes (Signed)
PPD#1 Feels ok, having reflux Afeb, BP sl labile, 110-160/70-100 Good diuresis Will d/c Magnesium, Omeprazole for reflux

## 2010-09-16 NOTE — Progress Notes (Signed)
Pt c/o of pain at IV site. No sign of infiltration noted. Will keep an eye on it

## 2010-09-16 NOTE — Addendum Note (Signed)
Addendum  created 09/16/10 1801 by Fanny Dance   Modules edited:Charges VN, Notes Section

## 2010-09-16 NOTE — Progress Notes (Signed)
Post Partum Day 1 Subjective: no complaints, up ad lib, tolerating PO and nl lochia, pain controlled  Objective: Blood pressure 143/88, pulse 88, temperature 98.2 F (36.8 C), temperature source Oral, resp. rate 16, height 5' (1.524 m), weight 57.97 kg (127 lb 12.8 oz), unknown if currently breastfeeding.  Physical Exam:  General: alert and no distress Lochia: appropriate Uterine Fundus: firm  DVT Evaluation: No evidence of DVT seen on physical exam.   Basename 09/16/10 0500 09/15/10 2235  HGB 11.7* 13.4  HCT 35.1* 40.2    Assessment/Plan: Plan for discharge tomorrow.  Magnesium until this evening, transfer to [facility].  PIH labs in AM, doing well   LOS: 1 day   BOVARD,Samir Ishaq 09/16/2010, 8:48 AM

## 2010-09-16 NOTE — Progress Notes (Signed)
UR chart review completed.  

## 2010-09-16 NOTE — Progress Notes (Addendum)
Dr. Jackelyn Knife notified re inc BP (175/96 at 2206)  prior to transfer - no orders received - will reevaluate in AM.  Pt denies denies HA/VD/EP on transfer.  Reflexes WNL/no clonus, minimal edema noted.

## 2010-09-16 NOTE — Anesthesia Postprocedure Evaluation (Signed)
  Anesthesia Post-op Note  Patient: [name] [name]  Procedure(s) Performed: * No procedures listed *  Patient Location: ICU  Anesthesia Type: Epidural  Level of Consciousness: awake, alert  and oriented  Airway and Oxygen Therapy: Patient Spontanous Breathing  Post-op Pain: mild  Post-op Assessment: Patient's Cardiovascular Status Stable and Respiratory Function Stable  Post-op Vital Signs: Reviewed  Complications: No apparent anesthesia complications

## 2010-09-16 NOTE — Progress Notes (Signed)
Pt still c/o of pain at IV site. Offered to change site. Pt refused. Will continue to monitor

## 2010-09-17 LAB — COMPREHENSIVE METABOLIC PANEL
Alkaline Phosphatase: 119 U/L — ABNORMAL HIGH (ref 39–117)
BUN: 12 mg/dL (ref 6–23)
Creatinine, Ser: 0.47 mg/dL — ABNORMAL LOW (ref 0.50–1.10)
Glucose, Bld: 64 mg/dL — ABNORMAL LOW (ref 70–99)
Potassium: 3.3 mEq/L — ABNORMAL LOW (ref 3.5–5.1)
Total Bilirubin: 0.2 mg/dL — ABNORMAL LOW (ref 0.3–1.2)
Total Protein: 5 g/dL — ABNORMAL LOW (ref 6.0–8.3)

## 2010-09-17 LAB — CBC
HCT: 32.2 % — ABNORMAL LOW (ref 36.0–46.0)
Hemoglobin: 10.7 g/dL — ABNORMAL LOW (ref 12.0–15.0)
MCHC: 33.2 g/dL (ref 30.0–36.0)
MCV: 89.2 fL (ref 78.0–100.0)

## 2010-09-17 MED ORDER — PRENATAL PLUS 27-1 MG PO TABS: 1.0000 | ORAL_TABLET | Freq: Every day | ORAL | Status: DC

## 2010-09-17 MED ORDER — OXYCODONE-ACETAMINOPHEN 5-325 MG PO TABS: 1.0000 | ORAL_TABLET | ORAL | Status: AC | PRN

## 2010-09-17 MED ORDER — IBUPROFEN 600 MG PO TABS: 600.0000 mg | ORAL_TABLET | Freq: Four times a day (QID) | ORAL | Status: AC

## 2010-09-17 NOTE — Progress Notes (Signed)
Post Partum Day 2 Subjective: no complaints, tolerating PO and nl lochia, pain controlled  Objective: Blood pressure 134/82, pulse 65, temperature 98.6 F (37 C), temperature source Oral, resp. rate 18, height 5' (1.524 m), weight 57.97 kg (127 lb 12.8 oz), SpO2 98.00%, unknown if currently breastfeeding. BP in last 24 hr diastolic no greater than 100  Physical Exam:  General: alert and no distress s/p Magnesium Sulfate x 24 hr Lochia: appropriate Uterine Fundus: firm  DVT Evaluation: No evidence of DVT seen on physical exam.   Basename 09/17/10 0510 09/16/10 0500  HGB 10.7* 11.7*  HCT 32.2* 35.1*    Assessment/Plan: Discharge home d/c with Motrin, Percocet, PNV, f/u 2 wk for BP check.   LOS: 2 days   BOVARD,Ashlan Dignan 09/17/2010, 8:56 AM

## 2010-09-17 NOTE — Discharge Summary (Signed)
Obstetric Discharge Summary Reason for Admission: induction of labor and PreEclampsia Prenatal Procedures: Preeclampsia Intrapartum Procedures: spontaneous vaginal delivery Postpartum Procedures: none Complications-Operative and Postpartum: 1st degree perineal laceration Hemoglobin  Date Value Range Status  09/17/2010 10.7* 12.0-15.0 (g/dL) Final     HCT  Date Value Range Status  09/17/2010 32.2* 36.0-46.0 (%) Final    Discharge Diagnoses: Term Pregnancy-delivered and Preelampsia  Discharge Information: Date: 09/17/2010 Activity: pelvic rest Diet: routine Medications: PNV, Ibuprophen and Percocet Condition: stable Instructions: refer to practice specific booklet Discharge to: home Follow-up Information    Follow up with [name] [name],Lexandra Rettke, MD. Make an appointment in 2 weeks. (for BP check)    Contact information:   [address] [facility] [address] [place] [address] [phone]          Newborn Data: Live born female  Birth Weight: 6 lb 5.6 oz (2880 g) APGAR: 9, 9  Home with mother.  [name] [name],[name] [name] 09/17/2010, 9:05 AM

## 2010-09-26 NOTE — ED Provider Notes (Signed)
History     No chief complaint on file.  HPI Pt sent from office for outpatient NST. No distress, no complaints.   OB History as of 09/15/10    Grav Para Term Preterm Abortions TAB SAB Ect Mult Living   5 3 3  0 1 0 1 0 0 2      Past Medical History  Diagnosis Date  . High cholesterol   . Hypercholesterolemia     takes meds when not pregnant  . No pertinent past medical history   . Normal pregnancy, repeat 09/15/2010  . SVD (spontaneous vaginal delivery) 09/15/2010    Past Surgical History  Procedure Date  . No past surgeries     No family history on file.  History  Substance Use Topics  . Smoking status: Never Smoker   . Smokeless tobacco: Never Used  . Alcohol Use: No    Allergies:  Allergies  Allergen Reactions  . Amoxicillin   . Penicillins Rash    No prescriptions prior to admission    ROS Negative for contractions, bleeding, pain, + fetal movement Physical Exam   Blood pressure 139/86, pulse 80, temperature 98.4 F (36.9 C), temperature source Oral, resp. rate 18, unknown if currently breastfeeding.  Physical Exam No distress NST reactive - FHR 130s, mod variability, + accels, no decels  MAU Course  Procedures   Assessment and Plan  Reactive NST MSE completed Report called to Dr. Ellyn Hack by RN  [name] [name] 09/26/2010, 2:13 AM

## 2010-10-30 LAB — POCT URINALYSIS DIP (DEVICE)
Bilirubin Urine: NEGATIVE
Glucose, UA: NEGATIVE
Ketones, ur: NEGATIVE
Nitrite: NEGATIVE
Operator id: 239701

## 2010-10-30 LAB — URINE CULTURE

## 2010-10-30 LAB — POCT I-STAT, CHEM 8
Calcium, Ion: 1.28
Creatinine, Ser: 0.7
Glucose, Bld: 89
HCT: 44
Hemoglobin: 15

## 2010-10-30 LAB — POCT PREGNANCY, URINE
Operator id: 239701
Preg Test, Ur: NEGATIVE

## 2010-12-02 ENCOUNTER — Emergency Department (HOSPITAL_COMMUNITY): Payer: Medicaid Other

## 2010-12-02 ENCOUNTER — Emergency Department (HOSPITAL_COMMUNITY)
Admission: EM | Admit: 2010-12-02 | Discharge: 2010-12-02 | Disposition: A | Discharge status: Home / Self Care | Payer: Medicaid Other | Attending: Emergency Medicine | Admitting: Emergency Medicine

## 2010-12-02 DIAGNOSIS — Z79899 Other long term (current) drug therapy: Secondary | ICD-10-CM | POA: Insufficient documentation

## 2010-12-02 DIAGNOSIS — N390 Urinary tract infection, site not specified: Secondary | ICD-10-CM | POA: Insufficient documentation

## 2010-12-02 DIAGNOSIS — F411 Generalized anxiety disorder: Secondary | ICD-10-CM | POA: Insufficient documentation

## 2010-12-02 DIAGNOSIS — R112 Nausea with vomiting, unspecified: Secondary | ICD-10-CM | POA: Insufficient documentation

## 2010-12-02 DIAGNOSIS — E7800 Pure hypercholesterolemia, unspecified: Secondary | ICD-10-CM | POA: Insufficient documentation

## 2010-12-02 LAB — URINALYSIS, ROUTINE W REFLEX MICROSCOPIC
Bilirubin Urine: NEGATIVE
Nitrite: POSITIVE — AB
Urobilinogen, UA: 1 mg/dL (ref 0.0–1.0)

## 2010-12-02 LAB — BASIC METABOLIC PANEL
BUN: 12 mg/dL (ref 6–23)
Chloride: 99 mEq/L (ref 96–112)
Creatinine, Ser: 0.61 mg/dL (ref 0.50–1.10)
GFR calc Af Amer: >90 mL/min (ref >90)
GFR calc non Af Amer: >90 mL/min (ref >90)
Potassium: 3.2 mEq/L — ABNORMAL LOW (ref 3.5–5.1)

## 2010-12-02 LAB — PREGNANCY, URINE: Preg Test, Ur: NEGATIVE

## 2010-12-02 LAB — CBC
HCT: 38.5 % (ref 36.0–46.0)
MCHC: 34 g/dL (ref 30.0–36.0)
MCV: 85.4 fL (ref 78.0–100.0)
Platelets: 225 10*3/uL (ref 150–400)
RDW: 13 % (ref 11.5–15.5)
WBC: 14.9 10*3/uL — ABNORMAL HIGH (ref 4.0–10.5)

## 2010-12-02 LAB — DIFFERENTIAL
Basophils Absolute: 0 10*3/uL (ref 0.0–0.1)
Eosinophils Absolute: 0 10*3/uL (ref 0.0–0.7)
Eosinophils Relative: 0 % (ref 0–5)
Lymphocytes Relative: 10 % — ABNORMAL LOW (ref 12–46)
Monocytes Absolute: 1.4 10*3/uL — ABNORMAL HIGH (ref 0.1–1.0)

## 2010-12-02 MED ORDER — IOHEXOL 300 MG/ML  SOLN: 80.0000 mL | Freq: Once | INTRAMUSCULAR | Status: DC | PRN

## 2010-12-04 LAB — URINE CULTURE
Colony Count: >=100000
Culture  Setup Time: 201210310420

## 2011-07-28 LAB — OB RESULTS CONSOLE HIV ANTIBODY (ROUTINE TESTING): HIV: NONREACTIVE

## 2011-07-28 LAB — OB RESULTS CONSOLE GC/CHLAMYDIA: Gonorrhea: NEGATIVE

## 2011-12-29 ENCOUNTER — Other Ambulatory Visit: Payer: Self-pay | Admitting: Obstetrics and Gynecology

## 2012-01-01 ENCOUNTER — Encounter (HOSPITAL_COMMUNITY): Payer: Self-pay | Admitting: *Deleted

## 2012-01-01 ENCOUNTER — Inpatient Hospital Stay (HOSPITAL_COMMUNITY)
Admission: AD | Admit: 2012-01-01 | Discharge: 2012-01-01 | Disposition: A | Discharge status: Home / Self Care | Payer: Medicaid Other | Source: Ambulatory Visit | Attending: Obstetrics and Gynecology | Admitting: Obstetrics and Gynecology

## 2012-01-01 DIAGNOSIS — O99810 Abnormal glucose complicating pregnancy: Secondary | ICD-10-CM | POA: Insufficient documentation

## 2012-01-01 DIAGNOSIS — O365990 Maternal care for other known or suspected poor fetal growth, unspecified trimester, not applicable or unspecified: Secondary | ICD-10-CM | POA: Insufficient documentation

## 2012-01-01 HISTORY — DX: Gestational diabetes mellitus in pregnancy, unspecified control: O24.419

## 2012-01-01 NOTE — MAU Note (Signed)
Pt states she is here for NST for low birth weight and gestational diabetes.  No other complaints.  Pt has appt in office on Monday January 04, 2012

## 2012-01-18 LAB — OB RESULTS CONSOLE GBS: GBS: POSITIVE

## 2012-01-28 ENCOUNTER — Encounter (HOSPITAL_COMMUNITY): Payer: Self-pay | Admitting: *Deleted

## 2012-01-28 ENCOUNTER — Inpatient Hospital Stay (HOSPITAL_COMMUNITY)
Admission: AD | Admit: 2012-01-28 | Discharge: 2012-01-29 | Disposition: A | Discharge status: Home / Self Care | Payer: Medicaid Other | Source: Ambulatory Visit | Attending: Obstetrics and Gynecology | Admitting: Obstetrics and Gynecology

## 2012-01-28 DIAGNOSIS — O479 False labor, unspecified: Secondary | ICD-10-CM | POA: Insufficient documentation

## 2012-01-28 NOTE — MAU Note (Signed)
Pt states is having contractions and wantsto be checked

## 2012-01-29 NOTE — Discharge Instructions (Signed)
Fetal Movement Counts Patient Name: __________________________________________________ Patient Due Date: ____________________ Kick counts is highly recommended in high risk pregnancies, but it is a good idea for every pregnant woman to do. Start counting fetal movements at 28 weeks of the pregnancy. Fetal movements increase after eating a full meal or eating or drinking something sweet (the blood sugar is higher). It is also important to drink plenty of fluids (well hydrated) before doing the count. Lie on your left side because it helps with the circulation or you can sit in a comfortable chair with your arms over your belly (abdomen) with no distractions around you. DOING THE COUNT  Try to do the count the same time of day each time you do it.  Mark the day and time, then see how long it takes for you to feel 10 movements (kicks, flutters, swishes, rolls). You should have at least 10 movements within 2 hours. You will most likely feel 10 movements in much less than 2 hours. If you do not, wait an hour and count again. After a couple of days you will see a pattern.  What you are looking for is a change in the pattern or not enough counts in 2 hours. Is it taking longer in time to reach 10 movements? SEEK MEDICAL CARE IF:  You feel less than 10 counts in 2 hours. Tried twice.  No movement in one hour.  The pattern is changing or taking longer each day to reach 10 counts in 2 hours.  You feel the baby is not moving as it usually does. Date: ____________ Movements: ____________ Start time: ____________ Finish time: ____________  Date: ____________ Movements: ____________ Start time: ____________ Finish time: ____________ Date: ____________ Movements: ____________ Start time: ____________ Finish time: ____________ Date: ____________ Movements: ____________ Start time: ____________ Finish time: ____________ Date: ____________ Movements: ____________ Start time: ____________ Finish time:  ____________ Date: ____________ Movements: ____________ Start time: ____________ Finish time: ____________ Date: ____________ Movements: ____________ Start time: ____________ Finish time: ____________ Date: ____________ Movements: ____________ Start time: ____________ Finish time: ____________  Date: ____________ Movements: ____________ Start time: ____________ Finish time: ____________ Date: ____________ Movements: ____________ Start time: ____________ Finish time: ____________ Date: ____________ Movements: ____________ Start time: ____________ Finish time: ____________ Date: ____________ Movements: ____________ Start time: ____________ Finish time: ____________ Date: ____________ Movements: ____________ Start time: ____________ Finish time: ____________ Date: ____________ Movements: ____________ Start time: ____________ Finish time: ____________ Date: ____________ Movements: ____________ Start time: ____________ Finish time: ____________  Date: ____________ Movements: ____________ Start time: ____________ Finish time: ____________ Date: ____________ Movements: ____________ Start time: ____________ Finish time: ____________ Date: ____________ Movements: ____________ Start time: ____________ Finish time: ____________ Date: ____________ Movements: ____________ Start time: ____________ Finish time: ____________ Date: ____________ Movements: ____________ Start time: ____________ Finish time: ____________ Date: ____________ Movements: ____________ Start time: ____________ Finish time: ____________ Date: ____________ Movements: ____________ Start time: ____________ Finish time: ____________  Date: ____________ Movements: ____________ Start time: ____________ Finish time: ____________ Date: ____________ Movements: ____________ Start time: ____________ Finish time: ____________ Date: ____________ Movements: ____________ Start time: ____________ Finish time: ____________ Date: ____________ Movements:  ____________ Start time: ____________ Finish time: ____________ Date: ____________ Movements: ____________ Start time: ____________ Finish time: ____________ Date: ____________ Movements: ____________ Start time: ____________ Finish time: ____________ Date: ____________ Movements: ____________ Start time: ____________ Finish time: ____________  Date: ____________ Movements: ____________ Start time: ____________ Finish time: ____________ Date: ____________ Movements: ____________ Start time: ____________ Finish time: ____________ Date: ____________ Movements: ____________ Start time: ____________ Finish time: ____________ Date: ____________ Movements:   ____________ Start time: ____________ Finish time: ____________ Date: ____________ Movements: ____________ Start time: ____________ Finish time: ____________ Date: ____________ Movements: ____________ Start time: ____________ Finish time: ____________ Date: ____________ Movements: ____________ Start time: ____________ Finish time: ____________  Date: ____________ Movements: ____________ Start time: ____________ Finish time: ____________ Date: ____________ Movements: ____________ Start time: ____________ Finish time: ____________ Date: ____________ Movements: ____________ Start time: ____________ Finish time: ____________ Date: ____________ Movements: ____________ Start time: ____________ Finish time: ____________ Date: ____________ Movements: ____________ Start time: ____________ Finish time: ____________ Date: ____________ Movements: ____________ Start time: ____________ Finish time: ____________ Date: ____________ Movements: ____________ Start time: ____________ Finish time: ____________  Date: ____________ Movements: ____________ Start time: ____________ Finish time: ____________ Date: ____________ Movements: ____________ Start time: ____________ Finish time: ____________ Date: ____________ Movements: ____________ Start time: ____________ Finish  time: ____________ Date: ____________ Movements: ____________ Start time: ____________ Finish time: ____________ Date: ____________ Movements: ____________ Start time: ____________ Finish time: ____________ Date: ____________ Movements: ____________ Start time: ____________ Finish time: ____________ Date: ____________ Movements: ____________ Start time: ____________ Finish time: ____________  Date: ____________ Movements: ____________ Start time: ____________ Finish time: ____________ Date: ____________ Movements: ____________ Start time: ____________ Finish time: ____________ Date: ____________ Movements: ____________ Start time: ____________ Finish time: ____________ Date: ____________ Movements: ____________ Start time: ____________ Finish time: ____________ Date: ____________ Movements: ____________ Start time: ____________ Finish time: ____________ Date: ____________ Movements: ____________ Start time: ____________ Finish time: ____________ Document Released: 02/18/2006 Document Revised: 04/13/2011 Document Reviewed: 08/21/2008 [employer] Patient Information 2013 [employer]  Braxton Hicks Contractions Pregnancy is commonly associated with contractions of the uterus throughout the pregnancy. Towards the end of pregnancy (32 to 34 weeks), these contractions (Braxton Hicks) can develop more often and may become more forceful. This is not true labor because these contractions do not result in opening (dilatation) and thinning of the cervix. They are sometimes difficult to tell apart from true labor because these contractions can be forceful and people have different pain tolerances. You should not feel embarrassed if you go to the hospital with false labor. Sometimes, the only way to tell if you are in true labor is for your caregiver to follow the changes in the cervix. How to tell the difference between true and false labor:  False labor.  The contractions of false labor are usually  shorter, irregular and not as hard as those of true labor.  They are often felt in the front of the lower abdomen and in the groin.  They may leave with walking around or changing positions while lying down.  They get weaker and are shorter lasting as time goes on.  These contractions are usually irregular.  They do not usually become progressively stronger, regular and closer together as with true labor.  True labor.  Contractions in true labor last 30 to 70 seconds, become very regular, usually become more intense, and increase in frequency.  They do not go away with walking.  The discomfort is usually felt in the top of the uterus and spreads to the lower abdomen and low back.  True labor can be determined by your caregiver with an exam. This will show that the cervix is dilating and getting thinner. If there are no prenatal problems or other health problems associated with the pregnancy, it is completely safe to be sent home with false labor and await the onset of true labor. HOME CARE INSTRUCTIONS   Keep up with your usual exercises and instructions.  Take medications as directed.  Keep your regular prenatal appointment.    Eat and drink lightly if you think you are going into labor.  If BH contractions are making you uncomfortable:  Change your activity position from lying down or resting to walking/walking to resting.  Sit and rest in a tub of warm water.  Drink 2 to 3 glasses of water. Dehydration may cause B-H contractions.  Do slow and deep breathing several times an hour. SEEK IMMEDIATE MEDICAL CARE IF:   Your contractions continue to become stronger, more regular, and closer together.  You have a gushing, burst or leaking of fluid from the vagina.  An oral temperature above 102 F (38.9 C) develops.  You have passage of blood-tinged mucus.  You develop vaginal bleeding.  You develop continuous belly (abdominal) pain.  You have low back pain that you  never had before.  You feel the baby's head pushing down causing pelvic pressure.  The baby is not moving as much as it used to. Document Released: 01/19/2005 Document Revised: 04/13/2011 Document Reviewed: 07/13/2008 [employer] Patient Information 2013 [employer].  

## 2012-01-29 NOTE — MAU Note (Signed)
Dr Ambrose Mantle notified of pt status-to recheck in 1 hour and let him recall hm the findings

## 2012-02-12 ENCOUNTER — Other Ambulatory Visit (HOSPITAL_COMMUNITY): Payer: Self-pay | Admitting: Obstetrics and Gynecology

## 2012-02-12 ENCOUNTER — Ambulatory Visit (HOSPITAL_COMMUNITY)
Admission: RE | Admit: 2012-02-12 | Discharge: 2012-02-12 | Disposition: A | Discharge status: Home / Self Care | Payer: Medicaid Other | Source: Ambulatory Visit | Attending: Obstetrics and Gynecology | Admitting: Obstetrics and Gynecology

## 2012-02-12 DIAGNOSIS — O368390 Maternal care for abnormalities of the fetal heart rate or rhythm, unspecified trimester, not applicable or unspecified: Secondary | ICD-10-CM | POA: Insufficient documentation

## 2012-02-12 DIAGNOSIS — IMO0002 Reserved for concepts with insufficient information to code with codable children: Secondary | ICD-10-CM

## 2012-02-12 DIAGNOSIS — O09529 Supervision of elderly multigravida, unspecified trimester: Secondary | ICD-10-CM | POA: Insufficient documentation

## 2012-02-17 ENCOUNTER — Encounter (HOSPITAL_COMMUNITY): Payer: Self-pay | Admitting: *Deleted

## 2012-02-17 ENCOUNTER — Telehealth (HOSPITAL_COMMUNITY): Payer: Self-pay | Admitting: *Deleted

## 2012-02-17 NOTE — Telephone Encounter (Signed)
Preadmission screen  

## 2012-02-18 ENCOUNTER — Encounter (HOSPITAL_COMMUNITY): Payer: Self-pay | Admitting: Anesthesiology

## 2012-02-18 ENCOUNTER — Inpatient Hospital Stay (HOSPITAL_COMMUNITY): Payer: Medicaid Other | Admitting: Anesthesiology

## 2012-02-18 ENCOUNTER — Inpatient Hospital Stay (HOSPITAL_COMMUNITY)
Admission: AD | Admit: 2012-02-18 | Discharge: 2012-02-20 | DRG: 775 | Disposition: A | Discharge status: Home / Self Care | Payer: Medicaid Other | Source: Ambulatory Visit | Attending: Obstetrics and Gynecology | Admitting: Obstetrics and Gynecology

## 2012-02-18 ENCOUNTER — Other Ambulatory Visit: Payer: Self-pay | Admitting: Obstetrics and Gynecology

## 2012-02-18 ENCOUNTER — Encounter (HOSPITAL_COMMUNITY): Payer: Self-pay | Admitting: *Deleted

## 2012-02-18 DIAGNOSIS — O2650 Maternal hypotension syndrome, unspecified trimester: Secondary | ICD-10-CM | POA: Diagnosis not present

## 2012-02-18 DIAGNOSIS — O99814 Abnormal glucose complicating childbirth: Secondary | ICD-10-CM | POA: Diagnosis present

## 2012-02-18 DIAGNOSIS — O99892 Other specified diseases and conditions complicating childbirth: Secondary | ICD-10-CM | POA: Diagnosis present

## 2012-02-18 DIAGNOSIS — O09529 Supervision of elderly multigravida, unspecified trimester: Secondary | ICD-10-CM | POA: Diagnosis present

## 2012-02-18 DIAGNOSIS — Z22330 Carrier of Group B streptococcus: Secondary | ICD-10-CM

## 2012-02-18 DIAGNOSIS — O24414 Gestational diabetes mellitus in pregnancy, insulin controlled: Secondary | ICD-10-CM

## 2012-02-18 HISTORY — DX: Gestational diabetes mellitus in pregnancy, insulin controlled: O24.414

## 2012-02-18 LAB — PREPARE RBC (CROSSMATCH)

## 2012-02-18 LAB — CBC
Hemoglobin: 12.1 g/dL (ref 12.0–15.0)
MCH: 29.1 pg (ref 26.0–34.0)
Platelets: 238 10*3/uL (ref 150–400)
RBC: 4.16 MIL/uL (ref 3.87–5.11)

## 2012-02-18 LAB — GLUCOSE, RANDOM: Glucose, Bld: 61 mg/dL — ABNORMAL LOW (ref 70–99)

## 2012-02-18 LAB — RPR: RPR Ser Ql: NONREACTIVE

## 2012-02-18 LAB — GLUCOSE, CAPILLARY: Glucose-Capillary: 63 mg/dL — ABNORMAL LOW (ref 70–99)

## 2012-02-18 MED ORDER — CEFAZOLIN SODIUM 1-5 GM-% IV SOLN
1.0000 g | Freq: Three times a day (TID) | INTRAVENOUS | Status: DC
  Filled 2012-02-18 (×2): qty 50

## 2012-02-18 MED ORDER — LIDOCAINE HCL (PF) 1 % IJ SOLN: 30.0000 mL | INTRAMUSCULAR | Status: DC | PRN

## 2012-02-18 MED ORDER — ONDANSETRON HCL 4 MG/2ML IJ SOLN: 4.0000 mg | INTRAMUSCULAR | Status: DC | PRN

## 2012-02-18 MED ORDER — EPHEDRINE 5 MG/ML INJ: 10.0000 mg | INTRAVENOUS | Status: DC | PRN

## 2012-02-18 MED ORDER — PHENYLEPHRINE 40 MCG/ML (10ML) SYRINGE FOR IV PUSH (FOR BLOOD PRESSURE SUPPORT): 80.0000 ug | PREFILLED_SYRINGE | INTRAVENOUS | Status: DC | PRN

## 2012-02-18 MED ORDER — LACTATED RINGERS IV SOLN
INTRAVENOUS | Status: AC
  Administered 2012-02-19: 06:00:00 via INTRAVENOUS

## 2012-02-18 MED ORDER — OXYTOCIN 40 UNITS IN LACTATED RINGERS INFUSION - SIMPLE MED
62.5000 mL/h | INTRAVENOUS | Status: AC | PRN
  Filled 2012-02-18: qty 1000

## 2012-02-18 MED ORDER — BENZOCAINE-MENTHOL 20-0.5 % EX AERO
1.0000 "application " | INHALATION_SPRAY | CUTANEOUS | Status: DC | PRN
  Administered 2012-02-19: 1 via TOPICAL
  Filled 2012-02-18: qty 56

## 2012-02-18 MED ORDER — PRENATAL MULTIVITAMIN CH: 1.0000 | ORAL_TABLET | Freq: Every day | ORAL | Status: DC

## 2012-02-18 MED ORDER — LACTATED RINGERS IV SOLN: 500.0000 mL | INTRAVENOUS | Status: DC | PRN

## 2012-02-18 MED ORDER — LIDOCAINE HCL (PF) 1 % IJ SOLN
INTRAMUSCULAR | Status: DC | PRN
  Administered 2012-02-18 (×3): 4 mL

## 2012-02-18 MED ORDER — LANOLIN HYDROUS EX OINT: TOPICAL_OINTMENT | CUTANEOUS | Status: DC | PRN

## 2012-02-18 MED ORDER — DIBUCAINE 1 % RE OINT
1.0000 "application " | TOPICAL_OINTMENT | RECTAL | Status: DC | PRN
  Administered 2012-02-19: 1 via RECTAL
  Filled 2012-02-18: qty 28

## 2012-02-18 MED ORDER — OXYTOCIN 40 UNITS IN LACTATED RINGERS INFUSION - SIMPLE MED
1.0000 m[IU]/min | INTRAVENOUS | Status: DC
  Administered 2012-02-18: 1 m[IU]/min via INTRAVENOUS
  Filled 2012-02-18: qty 1000

## 2012-02-18 MED ORDER — TETANUS-DIPHTH-ACELL PERTUSSIS 5-2.5-18.5 LF-MCG/0.5 IM SUSP: 0.5000 mL | Freq: Once | INTRAMUSCULAR | Status: DC

## 2012-02-18 MED ORDER — OXYCODONE-ACETAMINOPHEN 5-325 MG PO TABS: 1.0000 | ORAL_TABLET | ORAL | Status: DC | PRN

## 2012-02-18 MED ORDER — ZOLPIDEM TARTRATE 5 MG PO TABS: 5.0000 mg | ORAL_TABLET | Freq: Every evening | ORAL | Status: DC | PRN

## 2012-02-18 MED ORDER — WITCH HAZEL-GLYCERIN EX PADS
1.0000 "application " | MEDICATED_PAD | CUTANEOUS | Status: DC | PRN
  Administered 2012-02-19: 1 via TOPICAL

## 2012-02-18 MED ORDER — SIMETHICONE 80 MG PO CHEW: 80.0000 mg | CHEWABLE_TABLET | ORAL | Status: DC | PRN

## 2012-02-18 MED ORDER — IBUPROFEN 600 MG PO TABS
600.0000 mg | ORAL_TABLET | Freq: Four times a day (QID) | ORAL | Status: DC
  Administered 2012-02-19 – 2012-02-20 (×4): 600 mg via ORAL
  Filled 2012-02-18 (×7): qty 1

## 2012-02-18 MED ORDER — TERBUTALINE SULFATE 1 MG/ML IJ SOLN: 0.2500 mg | Freq: Once | INTRAMUSCULAR | Status: DC | PRN

## 2012-02-18 MED ORDER — ONDANSETRON HCL 4 MG/2ML IJ SOLN: 4.0000 mg | Freq: Four times a day (QID) | INTRAMUSCULAR | Status: DC | PRN

## 2012-02-18 MED ORDER — DIPHENHYDRAMINE HCL 25 MG PO CAPS: 25.0000 mg | ORAL_CAPSULE | Freq: Four times a day (QID) | ORAL | Status: DC | PRN

## 2012-02-18 MED ORDER — EPHEDRINE 5 MG/ML INJ
10.0000 mg | INTRAVENOUS | Status: DC | PRN
  Filled 2012-02-18: qty 4

## 2012-02-18 MED ORDER — OXYTOCIN 40 UNITS IN LACTATED RINGERS INFUSION - SIMPLE MED: 62.5000 mL/h | INTRAVENOUS | Status: DC

## 2012-02-18 MED ORDER — LACTATED RINGERS IV SOLN
INTRAVENOUS | Status: DC
  Administered 2012-02-18 (×2): via INTRAVENOUS

## 2012-02-18 MED ORDER — OXYTOCIN BOLUS FROM INFUSION: 500.0000 mL | INTRAVENOUS | Status: DC

## 2012-02-18 MED ORDER — CALCIUM CARBONATE ANTACID 500 MG PO CHEW
1.0000 | CHEWABLE_TABLET | Freq: Three times a day (TID) | ORAL | Status: DC
  Administered 2012-02-19 – 2012-02-20 (×3): 200 mg via ORAL
  Filled 2012-02-18 (×3): qty 1

## 2012-02-18 MED ORDER — CEFAZOLIN SODIUM-DEXTROSE 2-3 GM-% IV SOLR
2.0000 g | Freq: Once | INTRAVENOUS | Status: AC
  Administered 2012-02-18: 2 g via INTRAVENOUS
  Filled 2012-02-18: qty 50

## 2012-02-18 MED ORDER — IBUPROFEN 600 MG PO TABS: 600.0000 mg | ORAL_TABLET | Freq: Four times a day (QID) | ORAL | Status: DC | PRN

## 2012-02-18 MED ORDER — CITRIC ACID-SODIUM CITRATE 334-500 MG/5ML PO SOLN: 30.0000 mL | ORAL | Status: DC | PRN

## 2012-02-18 MED ORDER — FLEET ENEMA 7-19 GM/118ML RE ENEM: 1.0000 | ENEMA | RECTAL | Status: DC | PRN

## 2012-02-18 MED ORDER — MEASLES, MUMPS & RUBELLA VAC ~~LOC~~ INJ: 0.5000 mL | INJECTION | Freq: Once | SUBCUTANEOUS | Status: DC

## 2012-02-18 MED ORDER — FENTANYL 2.5 MCG/ML BUPIVACAINE 1/10 % EPIDURAL INFUSION (WH - ANES)
14.0000 mL/h | INTRAMUSCULAR | Status: DC
  Administered 2012-02-18: 14 mL/h via EPIDURAL
  Filled 2012-02-18: qty 125

## 2012-02-18 MED ORDER — ALPRAZOLAM 0.5 MG PO TABS
0.2500 mg | ORAL_TABLET | Freq: Once | ORAL | Status: AC
  Administered 2012-02-18: 0.25 mg via ORAL
  Filled 2012-02-18: qty 1

## 2012-02-18 MED ORDER — SENNOSIDES-DOCUSATE SODIUM 8.6-50 MG PO TABS
2.0000 | ORAL_TABLET | Freq: Every day | ORAL | Status: DC
  Administered 2012-02-19: 2 via ORAL

## 2012-02-18 MED ORDER — PHENYLEPHRINE 40 MCG/ML (10ML) SYRINGE FOR IV PUSH (FOR BLOOD PRESSURE SUPPORT)
80.0000 ug | PREFILLED_SYRINGE | INTRAVENOUS | Status: DC | PRN
  Filled 2012-02-18: qty 5

## 2012-02-18 MED ORDER — DIPHENHYDRAMINE HCL 50 MG/ML IJ SOLN: 12.5000 mg | INTRAMUSCULAR | Status: DC | PRN

## 2012-02-18 MED ORDER — ONDANSETRON HCL 4 MG PO TABS: 4.0000 mg | ORAL_TABLET | ORAL | Status: DC | PRN

## 2012-02-18 MED ORDER — OXYCODONE-ACETAMINOPHEN 5-325 MG PO TABS
1.0000 | ORAL_TABLET | ORAL | Status: DC | PRN
  Administered 2012-02-20: 1 via ORAL
  Filled 2012-02-18: qty 1

## 2012-02-18 MED ORDER — ACETAMINOPHEN 325 MG PO TABS: 650.0000 mg | ORAL_TABLET | ORAL | Status: DC | PRN

## 2012-02-18 MED ORDER — LACTATED RINGERS IV SOLN: 500.0000 mL | Freq: Once | INTRAVENOUS | Status: DC

## 2012-02-18 NOTE — Progress Notes (Signed)
Pt c/o of headache and feeling light headed. VS WNL except BP elavated. Pt very concerned about numbness in her legs. Explained to pt that the numbness was from the epidural and was WNL. Also reassured pt that everything was OK with her. Pt states she suffers from anxiety/panic attacks. Cont to give pt reassurance remaining at bedside.

## 2012-02-18 NOTE — Progress Notes (Signed)
Patient ID: [name] [name], female   DOB: 1970/04/19, 42 y.o.   MRN: [id number] Delivery note:   The pt pushed well and delivered a living female infant ROA over a first degree perineal laceration. There was mild shoulder dystocia managed by McRoberts and woodscrew maneuvers and reaching under the right axilla. Apgars were 9 and 9 at 1 and 5 minutes. The placenta delivered intact and the uterus was normal. The laceration was repaired with 3-0 vicryl EBL 500 cc's, most of which was before the placenta separated.

## 2012-02-18 NOTE — Progress Notes (Signed)
Patient ID: [name] [name], female   DOB: [birth year], 42 y.o.   MRN: [id number] Pt has an epidural The contractions are q 3-5 minutes. The cervix is 4-5 cm 890 % effaced and the vertex is at 0/-1 station. Will start low dose pitocin

## 2012-02-18 NOTE — Anesthesia Procedure Notes (Signed)
Epidural Patient location during procedure: OB Start time: 02/18/2012 2:33 PM  Staffing Performed by: anesthesiologist   Preanesthetic Checklist Completed: patient identified, site marked, surgical consent, pre-op evaluation, timeout performed, IV checked, risks and benefits discussed and monitors and equipment checked  Epidural Patient position: sitting Prep: site prepped and draped and DuraPrep Patient monitoring: continuous pulse ox and blood pressure Approach: midline Injection technique: LOR air  Needle:  Needle type: Tuohy  Needle gauge: 17 G Needle length: 9 cm and 9 Needle insertion depth: 4 cm Catheter type: closed end flexible Catheter size: 19 Gauge Catheter at skin depth: 9 cm Test dose: negative  Assessment Events: blood not aspirated, injection not painful, no injection resistance, negative IV test and paresthesia (left leg, transient)  Additional Notes Discussed risk of headache, infection, bleeding, nerve injury and failed or incomplete block.  Patient voices understanding and wishes to proceed.  Patient very anxious about epidural.  Asked me to wait to place epidural until she was having a contraction (wanted it placed DURING the contraction to distract her).  Waited 5 minutes, no contraction came.  Asked patient if we could proceed without contraction, she said OK.  Started and patient immediately arching back and screaming - says she feels pressure.  Immediately stopped and reassured her that pressure is normal, and that she will need to round her back to get the epidural in place.  With assistance from nurse, patient's position improved and epidural placed easily.  Patient did have transient left paresthesia with placement of the catheter - patient very concerned about this, reassured repeatedly that this is normal.  No apparent complications.  Jasmine December, MD Reason for block:procedure for pain

## 2012-02-18 NOTE — Progress Notes (Signed)
Pt states she is feeling some better wants to sleep. Husband now at bedside.

## 2012-02-18 NOTE — Progress Notes (Signed)
Patient ID: [name] [name], female   DOB: November 13, 1970, 42 y.o.   MRN: [id number] Pt has an epidural. After she received the epidural she became quite anxious and requested medication. Xanax 0.25 mg given po.Her BS was 54 and she was given some sugar. It is now over 80. The contractions are q 2-4 minutes and pitocin was begun. The cervix is 6 cm 90 % effaced and the vertex is at 0 station.

## 2012-02-18 NOTE — Progress Notes (Signed)
Patient ID: [name] [name], female   DOB: 04/07/1970, 42 y.o.   MRN: [id number] Pitocin at 2 mu/inute and the contractions are q 2-3 minutes The cervix is 10 cm and the vertex is at +1/+2 station.

## 2012-02-18 NOTE — Anesthesia Preprocedure Evaluation (Signed)
Anesthesia Evaluation  Patient identified by MRN, date of birth, ID band Patient awake    Reviewed: Allergy & Precautions, H&P , NPO status , Patient's Chart, lab work & pertinent test results, reviewed documented beta blocker date and time   History of Anesthesia Complications Negative for: history of anesthetic complications  Airway Mallampati: III TM Distance: >3 FB Neck ROM: full    Dental  (+) Teeth Intact   Pulmonary neg pulmonary ROS,  breath sounds clear to auscultation        Cardiovascular negative cardio ROS  Rhythm:regular Rate:Normal     Neuro/Psych PSYCHIATRIC DISORDERS (depression/anxiety) negative neurological ROS     GI/Hepatic negative GI ROS, Neg liver ROS,   Endo/Other  diabetes, Gestational, Insulin Dependent  Renal/GU negative Renal ROS  negative genitourinary   Musculoskeletal   Abdominal   Peds  Hematology negative hematology ROS (+)   Anesthesia Other Findings   Reproductive/Obstetrics (+) Pregnancy                           Anesthesia Physical Anesthesia Plan  ASA: III  Anesthesia Plan: Epidural   Post-op Pain Management:    Induction:   Airway Management Planned:   Additional Equipment:   Intra-op Plan:   Post-operative Plan:   Informed Consent: I have reviewed the patients History and Physical, chart, labs and discussed the procedure including the risks, benefits and alternatives for the proposed anesthesia with the patient or authorized representative who has indicated his/her understanding and acceptance.     Plan Discussed with:   Anesthesia Plan Comments:         Anesthesia Quick Evaluation

## 2012-02-18 NOTE — Progress Notes (Signed)
Patient ID: [name] [name], female   DOB: 1970/03/31, 42 y.o.   MRN: [id number] Pt has begun her kefzol. She states she is contracting q 4 minutes. The cervix is 4 cm 70-80% effaced and the vertex is at - 1/2 station. The CBG is 61.

## 2012-02-18 NOTE — Progress Notes (Signed)
Pt BP remains elevated pt still having same concerns as before. Cont to offer reassurance and remain at bedside.

## 2012-02-18 NOTE — Progress Notes (Signed)
Pt cont to voice same concerns. Cont to offer reassurance and also offered to have Dr. Rodman Pickle come back and speak with her. Pt agreeable with that. Also offered to contact Dr. Ambrose Mantle to see if pt could have some medicine to relax her. Pts states she has had to use that before and would want that.

## 2012-02-19 LAB — CBC
MCH: 29.2 pg (ref 26.0–34.0)
MCHC: 34 g/dL (ref 30.0–36.0)
MCV: 85.9 fL (ref 78.0–100.0)
Platelets: 191 10*3/uL (ref 150–400)
RDW: 13.5 % (ref 11.5–15.5)
WBC: 13.9 10*3/uL — ABNORMAL HIGH (ref 4.0–10.5)

## 2012-02-19 LAB — GLUCOSE, CAPILLARY
Glucose-Capillary: 73 mg/dL (ref 70–99)
Glucose-Capillary: 95 mg/dL (ref 70–99)
Glucose-Capillary: 116 mg/dL — ABNORMAL HIGH (ref 70–99)
Glucose-Capillary: 131 mg/dL — ABNORMAL HIGH (ref 70–99)

## 2012-02-19 MED ORDER — SODIUM CHLORIDE 0.9 % IJ SOLN
3.0000 mL | INTRAMUSCULAR | Status: DC | PRN
  Administered 2012-02-19: 3 mL via INTRAVENOUS

## 2012-02-19 MED ORDER — OXYTOCIN 10 UNIT/ML IJ SOLN
40.0000 [IU] | INTRAVENOUS | Status: DC
  Filled 2012-02-19: qty 4

## 2012-02-19 NOTE — Progress Notes (Signed)
Clarification  of above Dr. Ambrose Mantle  note. CBG was done prior to apple juice given. Initial CBG  Was 73.

## 2012-02-19 NOTE — Anesthesia Postprocedure Evaluation (Signed)
   Patient: [name] [name]  Procedure(s) Performed: * No procedures listed *  Anesthesia type: Epidural  Patient location: Mother/Baby  Post pain: Pain level controlled  Post assessment: Post-op Vital signs reviewed  Last Vitals:  Filed Vitals:   02/19/12 1430  BP: 113/74  Pulse: 93  Temp: 37.2 C  Resp: 18    Post vital signs: Reviewed  Level of consciousness:alert  Complications: No apparent anesthesia complications

## 2012-02-19 NOTE — Progress Notes (Signed)
Admission nutrition screen triggered for weight loss > 10 Lbs in the past month. PNR indicates pt weighed 131 Lbs on 12/16, current weight 134 Lbs. There is no indication of a 10 lb weight loss. Patients chart reviewed and assessed  for nutritional risk. Patient is determined to be at low nutrition  risk.   Elisabeth Cara M.Odis Luster LDN Neonatal Nutrition Support Specialist Pager [phone]

## 2012-02-19 NOTE — Progress Notes (Signed)
Patient ID: [name] [name], female   DOB: [birth year], 42 y.o.   MRN: [id number] I was called because the pt had gone to the restroom and had become very weak and dizzy. She might have passed out for a brief moment. She was noted to have passes a couple of clots in the toilet the size of a quarter and some deeper in the toilet of an unknown size.She was helped back to bed and her VS were at 5:30 BP 95/69, 5:35 96/67 pulse 90 Subsequent BP's and pulses were in the same range. CBG's were in the normal range but the pt was given apple juice so original BS is unknown. The pt is awake and converses normally. The abdomen is soft and not tender and the uterus is relatively firm. There is 40 cc's of old blood clot in the uterus. There is no active bleeding. Will check her CBC and treat accordingly. At this time I am unable to document excessive bleeding as the cause for her dizziness.

## 2012-02-19 NOTE — Progress Notes (Signed)
PT PUT ON EMERGENCY LIGHT and RN'S x 3 found pt on commode ashen in color, arousable, speaking, faintly and dizzy.Pt fainted x 20 seconds, aroused with ammonia  pellet to nasal area.pt. Placed on stedy and transported to bed. Cbg's taken with juice given after drinking, fundal assessment done frequently, VS noted , Dr. Ambrose Mantle notified.

## 2012-02-19 NOTE — H&P (Signed)
NAME:  [name] [name], [name] [name]                       ACCOUNT NO.:  MEDICAL RECORD NO.:  [id number]  LOCATION:                                 FACILITY:  PHYSICIAN:  Malachi Pro. Ambrose Mantle, M.D. DATE OF BIRTH:  08-14-1970  DATE OF ADMISSION:  02/18/2012 DATE OF DISCHARGE:                             HISTORY & PHYSICAL   PRESENT ILLNESS:  This is a 42 year old Asian female, para 3-0-1-2, gravida 5 with [facility] February 22, 2012, admitted in early labor with contractions every 4 minutes, brownish discharge from the vagina.  Blood group and type B positive, negative antibody.  Pap smear normal. Rubella immune.  RPR nonreactive.  Urine culture negative.  Hepatitis B surface antigen negative, HIV negative, GC and Chlamydia negative. First trimester screen declined.  Cystic fibrosis negative.  AFP declined.  One hour Glucola 150.  Group B strep positive.  She was offered to have 3-hour GTT or to start monitoring her blood sugars.  She started monitoring her blood sugars and has been followed in that way. Throughout the pregnancy, she had trouble bringing her sugar logs to the appointments.  Most fastings were normal initially but postpartums were slightly elevated.  She was begun on glyburide 2.5 mg every morning.  It was subsequently increased to 5 mg in the morning and at 22 weeks, it was increased to 7.5 mg every morning.  At 23 weeks, her log was reviewed.  Most postpartums were still elevated on 5 mg.  Twenty-four weeks, fasting blood sugars were okay.  Most postprandials were between 150 and 190.  The glyburide was increased to 10 mg a day.  By 25 weeks, postprandials were still occasionally 150-180.  At 26 weeks, sugar log showed most postprandials were still elevated, some to greater than 200. Dr. Jackelyn Knife started Lantus 25 units every morning.  At 27 weeks, she was on 28 units every morning.  Fasting sugars were good.  Postprandials were still elevated and Lantus was increased to 32.  At 29-1/2  weeks, the fastings were 82-97, 1 hour was still elevated, increased the Lantus to 36 units every morning.  Twenty-nine weeks, the Lantus was increased to 38.  Nonstress test were begun.  Lantus was increased to 42 units at 29-1/2 weeks.  It was increased to 44 units at 31-1/2 weeks.  Nonstress test were reactive at 33 weeks, increased to 46 units.  She was treated with Silvadene for a local burn at 34-1/2 weeks.  Lantus was gradually increased to 53 units at 37 weeks, where it has remained.  Nonstress test reactive on February 12, 2012, there was a deceleration.  She had a biophysical profile that was normal.  PAST MEDICAL HISTORY:  Reveals allergic rhinitis, anxiety and depression, carpal tunnel syndrome, gestational diabetes, elevated cholesterol, hypertension.  SURGICAL HISTORY:  None.  ALLERGIES:  Penicillin at age 43 caused a rash.  No food allergies.  FAMILY HISTORY:  Sister with anxiety disorder.  Mother with brain cancer.  Sister with depression.  Brother with diabetes.  Father, mother, and sister with high blood pressure.  OBSTETRIC HISTORY:  She has had 3 vaginal deliveries and 1 miscarriage with a  D and C at that time.  Her second baby born in July 12, 2005 died at 38 months of age from asphyxia while with a babysitter.  PHYSICAL EXAMINATION:  VITAL SIGNS:  Blood pressure 140/82, pulse 80. HEART:  Normal size and sounds.  No murmurs. LUNGS:  Clear to auscultation. GU:  Fundal height 36 cm.  Fetal heart tones normal.  Cervix is 3 cm, 40%, vertex at a -2.  Brown discharge is present in the cervix.  ADMITTING IMPRESSION:  Intrauterine pregnancy at 39-1/2 weeks, gestational diabetes on insulin and early labor.  The patient is admitted.  She will be treated with Kefzol for her positive group B strep.  We will monitor the blood sugars and use of Glucommander if necessary.     Malachi Pro. Ambrose Mantle, M.D.     TFH/MEDQ  D:  02/18/2012  T:  02/18/2012  Job:  [id number]

## 2012-02-19 NOTE — Progress Notes (Signed)
0645-color improved and alert, remains tired and weak. Pt remains in supine position. Quiet. Iv lr with pitocin running at 125 cc's per hour. No new clots noted , minimal bleeding noted.3- report to York Spaniel given.

## 2012-02-19 NOTE — Plan of Care (Signed)
Problem: Discharge Progression Outcomes Goal: Complications resolved/controlled Unable to sit up without getting dizzy     

## 2012-02-19 NOTE — Plan of Care (Signed)
Problem: Discharge Progression Outcomes Goal: Complications resolved/controlled Unable to sit up without getting dizzy

## 2012-02-19 NOTE — Progress Notes (Signed)
0555 AM- DR. Henley present and vag exam and rectal exam done. 40 cc's vaginal clot removed, minimal bleeding noted. I v at 125 cc's hr pitocin 40 units IN  LR.FUNDUS REMAINING FIRM AT UE, occasional small gus of bright red blood, no active bleeding or hemorrhage  noted per DR. Henley. CBG'S INCREASING. O2 AT 2 L/ MIN , O2 SAT 100 %.

## 2012-02-19 NOTE — Progress Notes (Signed)
Patient ID: [name] [name], female   DOB: 21-Jun-1970, 42 y.o.   MRN: [id number] #1 afebrile No more bleeding HGB 12./1 to 8.7

## 2012-02-20 ENCOUNTER — Encounter (HOSPITAL_COMMUNITY): Payer: Self-pay | Admitting: Obstetrics and Gynecology

## 2012-02-20 DIAGNOSIS — O24414 Gestational diabetes mellitus in pregnancy, insulin controlled: Secondary | ICD-10-CM

## 2012-02-20 HISTORY — DX: Gestational diabetes mellitus in pregnancy, insulin controlled: O24.414

## 2012-02-20 LAB — CBC WITH DIFFERENTIAL/PLATELET
Basophils Absolute: 0 10*3/uL (ref 0.0–0.1)
Lymphocytes Relative: 25 % (ref 12–46)
Lymphs Abs: 2.6 10*3/uL (ref 0.7–4.0)
Neutro Abs: 6.9 10*3/uL (ref 1.7–7.7)
Neutrophils Relative %: 67 % (ref 43–77)
Platelets: 215 10*3/uL (ref 150–400)
RBC: 2.69 MIL/uL — ABNORMAL LOW (ref 3.87–5.11)
RDW: 13.3 % (ref 11.5–15.5)
WBC: 10.3 10*3/uL (ref 4.0–10.5)

## 2012-02-20 LAB — GLUCOSE, CAPILLARY
Glucose-Capillary: 68 mg/dL — ABNORMAL LOW (ref 70–99)
Glucose-Capillary: 85 mg/dL (ref 70–99)

## 2012-02-20 MED ORDER — PRENATAL VITAMINS (DIS) PO TABS: 1.0000 | ORAL_TABLET | Freq: Every day | ORAL | Status: DC

## 2012-02-20 MED ORDER — OXYCODONE-ACETAMINOPHEN 5-325 MG PO TABS: 1.0000 | ORAL_TABLET | Freq: Four times a day (QID) | ORAL | Status: DC | PRN

## 2012-02-20 MED ORDER — IBUPROFEN 800 MG PO TABS: 800.0000 mg | ORAL_TABLET | Freq: Three times a day (TID) | ORAL | Status: DC | PRN

## 2012-02-20 MED ORDER — FERROUS SULFATE 325 (65 FE) MG PO TABS: 325.0000 mg | ORAL_TABLET | Freq: Two times a day (BID) | ORAL | Status: DC

## 2012-02-20 NOTE — Progress Notes (Signed)
Hypoglycemic Event  CBG: 68  Treatment: 15 GM carbohydrate snack  Symptoms: Pale  Follow-up CBG: Time:0606 CBG Result:85  Possible Reasons for Event: Other: Gestational Diabetic  Comments/MD notified:patient felt faint and anxious, given cranberry juice and felt better    Charlyne Petrin  Remember to initiate Hypoglycemia Order Set & complete

## 2012-02-20 NOTE — Discharge Summary (Signed)
Obstetric Discharge Summary Reason for Admission: onset of labor, GDM Prenatal Procedures: none Intrapartum Procedures: spontaneous vaginal delivery Postpartum Procedures: none Complications-Operative and Postpartum: 1st degree perineal laceration and some decreased Hgb, some lightheadedness Hemoglobin  Date Value Range Status  02/20/2012 7.7* 12.0 - 15.0 g/dL Final     HCT  Date Value Range Status  02/20/2012 23.3* 36.0 - 46.0 % Final    Physical Exam:  General: alert and no distress Lochia: appropriate Uterine Fundus: firm   Discharge Diagnoses: Term Pregnancy-delivered, GDM  Discharge Information: Date: 02/20/2012 Activity: pelvic rest Diet: routine Medications: PNV, Ibuprofen, Iron and Percocet Condition: stable Instructions: refer to practice specific booklet Discharge to: home Follow-up Information    Follow up with Bing Plume, MD. Schedule an appointment as soon as possible for a visit in 6 weeks.   Contact information:   [address], [address] [place] [address] [phone]          Newborn Data: Live born female  Birth Weight: 8 lb 5 oz (3770 g) APGAR: 8, 9  Home with mother.  BOVARD,Beatriz Quintela 02/20/2012, 8:07 AM

## 2012-02-20 NOTE — Progress Notes (Signed)
Post Partum Day 2 Subjective: no complaints, up ad lib, voiding, tolerating PO and nl lochia, pain controlled  Objective: Blood pressure 128/80, pulse 69, temperature 97.8 F (36.6 C), temperature source Oral, resp. rate 18, height 5' (1.524 m), weight 60.782 kg (134 lb), last menstrual period 11/30/2010, SpO2 95.00%, unknown if currently breastfeeding.  Physical Exam:  General: alert and no distress Lochia: appropriate Uterine Fundus: firm    Basename 02/20/12 0528 02/19/12 0650  HGB 7.7* 8.7*  HCT 23.3* 25.6*    Assessment/Plan: Discharge home, Breastfeeding and Lactation consult.  Period of wooziness, but overall doing well.  D/c with motrin, percocet, pnv and iron.  F/u 6 weeks   LOS: 2 days   [name],[name] 02/20/2012, 7:44 AM

## 2012-02-21 LAB — TYPE AND SCREEN
ABO/RH(D): B POS
Antibody Screen: NEGATIVE
Unit division: 0
Unit division: 0

## 2012-02-22 NOTE — Progress Notes (Signed)
Post discharge chart review completed.  

## 2012-02-23 ENCOUNTER — Inpatient Hospital Stay (HOSPITAL_COMMUNITY): Admission: RE | Admit: 2012-02-23 | Payer: Medicaid Other | Source: Ambulatory Visit

## 2012-08-31 ENCOUNTER — Other Ambulatory Visit (HOSPITAL_COMMUNITY): Payer: Self-pay | Admitting: Obstetrics and Gynecology

## 2012-08-31 DIAGNOSIS — Z90710 Acquired absence of both cervix and uterus: Secondary | ICD-10-CM

## 2012-09-01 ENCOUNTER — Ambulatory Visit (HOSPITAL_COMMUNITY)
Admission: RE | Admit: 2012-09-01 | Discharge: 2012-09-01 | Disposition: A | Discharge status: Home / Self Care | Payer: BC Managed Care – PPO | Source: Ambulatory Visit | Attending: Obstetrics and Gynecology | Admitting: Obstetrics and Gynecology

## 2012-09-01 DIAGNOSIS — Z30431 Encounter for routine checking of intrauterine contraceptive device: Secondary | ICD-10-CM | POA: Insufficient documentation

## 2012-09-01 DIAGNOSIS — Z90710 Acquired absence of both cervix and uterus: Secondary | ICD-10-CM

## 2012-09-01 DIAGNOSIS — N971 Female infertility of tubal origin: Secondary | ICD-10-CM

## 2012-09-01 MED ORDER — IOHEXOL 300 MG/ML  SOLN
10.0000 mL | Freq: Once | INTRAMUSCULAR | Status: AC | PRN
  Administered 2012-09-01: 10 mL

## 2013-06-13 ENCOUNTER — Encounter (HOSPITAL_COMMUNITY): Payer: Self-pay | Admitting: Emergency Medicine

## 2013-06-13 ENCOUNTER — Emergency Department (HOSPITAL_COMMUNITY)
Admission: EM | Admit: 2013-06-13 | Discharge: 2013-06-14 | Disposition: A | Discharge status: Home / Self Care | Payer: BC Managed Care – PPO | Attending: Emergency Medicine | Admitting: Emergency Medicine

## 2013-06-13 ENCOUNTER — Emergency Department (HOSPITAL_COMMUNITY): Payer: BC Managed Care – PPO

## 2013-06-13 DIAGNOSIS — R059 Cough, unspecified: Secondary | ICD-10-CM

## 2013-06-13 DIAGNOSIS — F411 Generalized anxiety disorder: Secondary | ICD-10-CM | POA: Insufficient documentation

## 2013-06-13 DIAGNOSIS — J069 Acute upper respiratory infection, unspecified: Secondary | ICD-10-CM | POA: Insufficient documentation

## 2013-06-13 DIAGNOSIS — Z79899 Other long term (current) drug therapy: Secondary | ICD-10-CM | POA: Insufficient documentation

## 2013-06-13 DIAGNOSIS — R42 Dizziness and giddiness: Secondary | ICD-10-CM | POA: Insufficient documentation

## 2013-06-13 DIAGNOSIS — Z880 Allergy status to penicillin: Secondary | ICD-10-CM | POA: Insufficient documentation

## 2013-06-13 DIAGNOSIS — Z8719 Personal history of other diseases of the digestive system: Secondary | ICD-10-CM | POA: Insufficient documentation

## 2013-06-13 DIAGNOSIS — E7800 Pure hypercholesterolemia, unspecified: Secondary | ICD-10-CM | POA: Insufficient documentation

## 2013-06-13 DIAGNOSIS — F329 Major depressive disorder, single episode, unspecified: Secondary | ICD-10-CM | POA: Insufficient documentation

## 2013-06-13 DIAGNOSIS — F3289 Other specified depressive episodes: Secondary | ICD-10-CM | POA: Insufficient documentation

## 2013-06-13 DIAGNOSIS — Z8669 Personal history of other diseases of the nervous system and sense organs: Secondary | ICD-10-CM | POA: Insufficient documentation

## 2013-06-13 DIAGNOSIS — Z8679 Personal history of other diseases of the circulatory system: Secondary | ICD-10-CM | POA: Insufficient documentation

## 2013-06-13 DIAGNOSIS — R05 Cough: Secondary | ICD-10-CM

## 2013-06-13 MED ORDER — ALBUTEROL SULFATE (2.5 MG/3ML) 0.083% IN NEBU
5.0000 mg | INHALATION_SOLUTION | Freq: Once | RESPIRATORY_TRACT | Status: AC
  Administered 2013-06-14: 5 mg via RESPIRATORY_TRACT
  Filled 2013-06-13: qty 6

## 2013-06-13 NOTE — ED Notes (Signed)
Pt states that she has been having a sinus infection for a week the patient states she also believes that she is having bronchitis because she is coughing so much. Pt states chest tightness with the cough. Pt also tired and feels like she is going to pass out which is why she came in tonight.

## 2013-06-14 LAB — CBC
HCT: 36.7 % (ref 36.0–46.0)
HEMOGLOBIN: 12.4 g/dL (ref 12.0–15.0)
MCH: 30 pg (ref 26.0–34.0)
MCHC: 33.8 g/dL (ref 30.0–36.0)
MCV: 88.6 fL (ref 78.0–100.0)
Platelets: 212 10*3/uL (ref 150–400)
RBC: 4.14 MIL/uL (ref 3.87–5.11)
RDW: 12.8 % (ref 11.5–15.5)
WBC: 7.4 10*3/uL (ref 4.0–10.5)

## 2013-06-14 LAB — BASIC METABOLIC PANEL
BUN: 17 mg/dL (ref 6–23)
CO2: 25 meq/L (ref 19–32)
Calcium: 9.6 mg/dL (ref 8.4–10.5)
Chloride: 102 mEq/L (ref 96–112)
Creatinine, Ser: 0.64 mg/dL (ref 0.50–1.10)
GFR calc Af Amer: >90 mL/min (ref >90)
GLUCOSE: 146 mg/dL — AB (ref 70–99)
POTASSIUM: 4 meq/L (ref 3.7–5.3)
SODIUM: 139 meq/L (ref 137–147)

## 2013-06-14 LAB — TROPONIN I: Troponin I: <0.3 ng/mL (ref <0.30)

## 2013-06-14 LAB — PRO B NATRIURETIC PEPTIDE: PRO B NATRI PEPTIDE: 25.8 pg/mL (ref 0–125)

## 2013-06-14 MED ORDER — ACETAMINOPHEN 325 MG PO TABS
650.0000 mg | ORAL_TABLET | Freq: Once | ORAL | Status: AC
  Administered 2013-06-14: 650 mg via ORAL
  Filled 2013-06-14: qty 2

## 2013-06-14 MED ORDER — BENZONATATE 100 MG PO CAPS: 100.0000 mg | ORAL_CAPSULE | Freq: Three times a day (TID) | ORAL | Status: DC

## 2013-06-14 NOTE — ED Provider Notes (Signed)
CSN: [id number]     Arrival date & time 06/13/13  2230 History   First MD Initiated Contact with Patient 06/13/13 2304     Chief Complaint  Patient presents with  . Cough  . Chest Pain      HPI Patient reports sinus pressure cough and congestion as well as a sore throat over the past 4-5 days.  Her symptoms seem to be worsening.  She reports no dyspnea on exertion.  She denies orthopnea.  She states she feels generally weak and felt lightheaded tonight when she stood up.  She denies weakness of her arms or legs.  She believes that her appetite has been okay and she has been eating and drinking through the week.  She denies vaginal bleeding.  No urinary complaints.  No diarrhea.  No nausea or vomiting.   Past Medical History  Diagnosis Date  . High cholesterol   . Hypercholesterolemia     takes meds when not pregnant  . No pertinent past medical history   . Normal pregnancy, repeat 09/15/2010  . SVD (spontaneous vaginal delivery) 09/15/2010  . Gestational diabetes   . Anxiety   . Depression   . Carpal tunnel syndrome   . Hemorrhoids   . Dysrhythmia     palpitations with anxiety attacks  . Gestational diabetes requiring insulin 02/20/2012   Past Surgical History  Procedure Laterality Date  . No past surgeries     Family History  Problem Relation Age of Onset  . Cancer Mother     brain  . Hypertension Mother   . Hypertension Father   . Anxiety disorder Sister   . Depression Sister   . Hypertension Sister   . Diabetes Brother    History  Substance Use Topics  . Smoking status: Never Smoker   . Smokeless tobacco: Never Used  . Alcohol Use: No   OB History   Grav Para Term Preterm Abortions TAB SAB Ect Mult Living   5 4 4  0 1 0 1 0 0 4     Review of Systems  All other systems reviewed and are negative.     Allergies  Amoxicillin and Penicillins  Home Medications   Prior to Admission medications   Medication Sig Start Date End Date Taking? Authorizing  Provider  citalopram (CELEXA) 10 MG tablet Take 5 mg by mouth daily.   Yes Historical Provider, MD  fenofibrate (TRICOR) 48 MG tablet Take 48 mg by mouth daily.  06/06/13  Yes Historical Provider, MD  ibuprofen (ADVIL,MOTRIN) 200 MG tablet Take 200 mg by mouth every 6 (six) hours as needed for fever.   Yes Historical Provider, MD  lisinopril (PRINIVIL,ZESTRIL) 10 MG tablet Take 10 mg by mouth daily.  04/24/13  Yes Historical Provider, MD  phenylephrine (SUDAFED PE) 10 MG TABS tablet Take 10 mg by mouth every 4 (four) hours as needed (for congestion).   Yes Historical Provider, MD  benzonatate (TESSALON) 100 MG capsule Take 1 capsule (100 mg total) by mouth every 8 (eight) hours. 06/14/13   Lyanne CoKevin M Devantae Babe, MD   BP 135/77  Pulse 72  Temp(Src) 98.3 F (36.8 C) (Oral)  Resp 18  Ht 5' (1.524 m)  Wt 125 lb (56.7 kg)  BMI 24.41 kg/m2  SpO2 98%  LMP 06/06/2013 Physical Exam  Nursing note and vitals reviewed. Constitutional: She is oriented to person, place, and time. She appears well-developed and well-nourished. No distress.  HENT:  Head: Normocephalic and atraumatic.  Posterior pharynx is  normal.  Uvula is midline.  No posterior pharyngeal erythema or exudate.  Tolerating secretions  Eyes: EOM are normal.  Neck: Normal range of motion. No thyromegaly present.  Supple.  No lymphadenopathy.  Cardiovascular: Normal rate, regular rhythm and normal heart sounds.   Pulmonary/Chest: Effort normal and breath sounds normal. She has no wheezes.  Abdominal: Soft. She exhibits no distension. There is no tenderness.  Musculoskeletal: Normal range of motion.  Neurological: She is alert and oriented to person, place, and time.  Skin: Skin is warm and dry.  Psychiatric: She has a normal mood and affect. Judgment normal.    ED Course  Procedures (including critical care time) Labs Review Labs Reviewed  BASIC METABOLIC PANEL - Abnormal; Notable for the following:    Glucose, Bld 146 (*)    All other  components within normal limits  CBC  PRO B NATRIURETIC PEPTIDE  TROPONIN I    Imaging Review Dg Chest 2 View (if Patient Has Fever And/or Copd)  06/13/2013   CLINICAL DATA:  Cough  EXAM: CHEST  2 VIEW  COMPARISON:  04/01/2009  FINDINGS: Cardiac shadow is within normal limits. Mild vascular congestion is seen. No focal infiltrate is noted. Minimal left basilar atelectasis is noted. No acute bony abnormality is seen. A large nipple shadow is noted overlying the right lung base anteriorly.  IMPRESSION: Mild vascular congestion and minimal left basilar atelectasis.   Electronically Signed   By: Alcide CleverMark  Lukens M.D.   On: 06/13/2013 23:43     EKG Interpretation   Date/Time:  Tuesday Jun 13 2013 22:34:18 EDT Ventricular Rate:  77 PR Interval:  168 QRS Duration: 72 QT Interval:  404 QTC Calculation: 457 R Axis:   47 Text Interpretation:  Normal sinus rhythm Possible Left atrial enlargement  Cannot rule out Anterior infarct , age undetermined Abnormal ECG No  significant change was found Confirmed by Vinson Tietze  MD, Caryn BeeKEVIN ([id number]) on  06/13/2013 11:51:53 PM      MDM   Final diagnoses:  Cough  Viral upper respiratory illness    Patient viral upper respiratory tract infection.  Well-appearing.  Initial x-ray reported some vascular congestion.  Patient does not sound wet.  BNP is normal.    Lyanne CoKevin M Theodis Kinsel, MD 06/14/13 [phone]

## 2013-06-14 NOTE — ED Notes (Signed)
Called Lab to find out why Troponin and BNP had not resulted. Was told that they were never told to add tests to blood work sent down. They are running tests now.

## 2013-12-04 ENCOUNTER — Encounter (HOSPITAL_COMMUNITY): Payer: Self-pay | Admitting: Emergency Medicine

## 2014-02-02 HISTORY — PX: CARPAL TUNNEL RELEASE: SHX101

## 2014-12-24 ENCOUNTER — Emergency Department (HOSPITAL_COMMUNITY)
Admission: EM | Admit: 2014-12-24 | Discharge: 2014-12-24 | Disposition: A | Discharge status: Home / Self Care | Payer: 59 | Attending: Emergency Medicine | Admitting: Emergency Medicine

## 2014-12-24 ENCOUNTER — Encounter (HOSPITAL_COMMUNITY): Payer: Self-pay | Admitting: *Deleted

## 2014-12-24 DIAGNOSIS — Z3202 Encounter for pregnancy test, result negative: Secondary | ICD-10-CM | POA: Insufficient documentation

## 2014-12-24 DIAGNOSIS — R110 Nausea: Secondary | ICD-10-CM | POA: Diagnosis not present

## 2014-12-24 DIAGNOSIS — Z880 Allergy status to penicillin: Secondary | ICD-10-CM | POA: Insufficient documentation

## 2014-12-24 DIAGNOSIS — E7800 Pure hypercholesterolemia, unspecified: Secondary | ICD-10-CM | POA: Diagnosis not present

## 2014-12-24 DIAGNOSIS — Z8632 Personal history of gestational diabetes: Secondary | ICD-10-CM | POA: Insufficient documentation

## 2014-12-24 DIAGNOSIS — R1013 Epigastric pain: Secondary | ICD-10-CM | POA: Diagnosis not present

## 2014-12-24 DIAGNOSIS — F419 Anxiety disorder, unspecified: Secondary | ICD-10-CM | POA: Insufficient documentation

## 2014-12-24 DIAGNOSIS — Z8679 Personal history of other diseases of the circulatory system: Secondary | ICD-10-CM | POA: Diagnosis not present

## 2014-12-24 DIAGNOSIS — R55 Syncope and collapse: Secondary | ICD-10-CM

## 2014-12-24 DIAGNOSIS — F329 Major depressive disorder, single episode, unspecified: Secondary | ICD-10-CM | POA: Insufficient documentation

## 2014-12-24 DIAGNOSIS — Z79899 Other long term (current) drug therapy: Secondary | ICD-10-CM | POA: Diagnosis not present

## 2014-12-24 DIAGNOSIS — K649 Unspecified hemorrhoids: Secondary | ICD-10-CM

## 2014-12-24 LAB — COMPREHENSIVE METABOLIC PANEL
ALT: 41 U/L (ref 14–54)
ANION GAP: 10 (ref 5–15)
AST: 30 U/L (ref 15–41)
Albumin: 4.1 g/dL (ref 3.5–5.0)
Alkaline Phosphatase: 53 U/L (ref 38–126)
BILIRUBIN TOTAL: 0.3 mg/dL (ref 0.3–1.2)
BUN: 10 mg/dL (ref 6–20)
CHLORIDE: 107 mmol/L (ref 101–111)
CO2: 20 mmol/L — ABNORMAL LOW (ref 22–32)
Calcium: 9 mg/dL (ref 8.9–10.3)
Creatinine, Ser: 0.57 mg/dL (ref 0.44–1.00)
Glucose, Bld: 137 mg/dL — ABNORMAL HIGH (ref 65–99)
POTASSIUM: 4.1 mmol/L (ref 3.5–5.1)
Sodium: 137 mmol/L (ref 135–145)
TOTAL PROTEIN: 8.3 g/dL — AB (ref 6.5–8.1)

## 2014-12-24 LAB — CBC
HEMATOCRIT: 40.2 % (ref 36.0–46.0)
HEMOGLOBIN: 13.4 g/dL (ref 12.0–15.0)
MCH: 28.9 pg (ref 26.0–34.0)
MCHC: 33.3 g/dL (ref 30.0–36.0)
MCV: 86.6 fL (ref 78.0–100.0)
Platelets: 242 10*3/uL (ref 150–400)
RBC: 4.64 MIL/uL (ref 3.87–5.11)
RDW: 12.9 % (ref 11.5–15.5)
WBC: 7.8 10*3/uL (ref 4.0–10.5)

## 2014-12-24 LAB — URINALYSIS, ROUTINE W REFLEX MICROSCOPIC
BILIRUBIN URINE: NEGATIVE
GLUCOSE, UA: NEGATIVE mg/dL
Ketones, ur: NEGATIVE mg/dL
NITRITE: NEGATIVE
PH: 6 (ref 5.0–8.0)
Protein, ur: NEGATIVE mg/dL
SPECIFIC GRAVITY, URINE: 1.007 (ref 1.005–1.030)

## 2014-12-24 LAB — LIPASE, BLOOD: LIPASE: 36 U/L (ref 11–51)

## 2014-12-24 LAB — I-STAT BETA HCG BLOOD, ED (MC, WL, AP ONLY): I-stat hCG, quantitative: <5 m[IU]/mL (ref <5)

## 2014-12-24 LAB — URINE MICROSCOPIC-ADD ON

## 2014-12-24 MED ORDER — DICYCLOMINE HCL 20 MG PO TABS: 20.0000 mg | ORAL_TABLET | Freq: Two times a day (BID) | ORAL | Status: DC

## 2014-12-24 MED ORDER — SODIUM CHLORIDE 0.9 % IV BOLUS (SEPSIS): 500.0000 mL | Freq: Once | INTRAVENOUS | Status: DC

## 2014-12-24 MED ORDER — GI COCKTAIL ~~LOC~~
30.0000 mL | Freq: Once | ORAL | Status: AC
  Administered 2014-12-24: 30 mL via ORAL
  Filled 2014-12-24: qty 30

## 2014-12-24 MED ORDER — MORPHINE SULFATE (PF) 4 MG/ML IV SOLN
4.0000 mg | Freq: Once | INTRAVENOUS | Status: DC
  Filled 2014-12-24: qty 1

## 2014-12-24 MED ORDER — ONDANSETRON HCL 4 MG PO TABS: 4.0000 mg | ORAL_TABLET | Freq: Four times a day (QID) | ORAL | Status: DC

## 2014-12-24 MED ORDER — SODIUM CHLORIDE 0.9 % IV BOLUS (SEPSIS)
1000.0000 mL | Freq: Once | INTRAVENOUS | Status: AC
  Administered 2014-12-24: 1000 mL via INTRAVENOUS

## 2014-12-24 MED ORDER — PANTOPRAZOLE SODIUM 20 MG PO TBEC: 20.0000 mg | DELAYED_RELEASE_TABLET | Freq: Every day | ORAL | Status: AC

## 2014-12-24 MED ORDER — PANTOPRAZOLE SODIUM 40 MG IV SOLR
40.0000 mg | Freq: Once | INTRAVENOUS | Status: AC
  Administered 2014-12-24: 40 mg via INTRAVENOUS
  Filled 2014-12-24: qty 40

## 2014-12-24 MED ORDER — SUCRALFATE 1 G PO TABS: 1.0000 g | ORAL_TABLET | Freq: Three times a day (TID) | ORAL | Status: DC

## 2014-12-24 MED ORDER — ONDANSETRON HCL 4 MG/2ML IJ SOLN
4.0000 mg | Freq: Once | INTRAMUSCULAR | Status: AC
  Administered 2014-12-24: 4 mg via INTRAVENOUS
  Filled 2014-12-24: qty 2

## 2014-12-24 NOTE — ED Provider Notes (Signed)
CSN: [id number]     Arrival date & time 12/24/14  [id number] History   First MD Initiated Contact with Patient 12/24/14 0913     Chief Complaint  Patient presents with  . Emesis  . Hematemesis     (Consider location/radiation/quality/duration/timing/severity/associated sxs/prior Treatment) HPI  [name] [name] Is a 44 year old [place] female who presents emergency Department with chief complaint of abdominal pain and rectal bleeding. States that she ate [employer] 3 nights ago. She began having burning pain in her epigastrium along with chronic nausea and no vomiting since that time. She states that she has been feeling very sick. She used honey, T and lemon to try to see if her stomach. She states that it worked somewhat, however, she continues to have burning. The patient states that this morning she urinated and when she stood up she felt as if she was going to pass out. She did not fully lose consciousness. She feels weak and lightheaded. The patient states that she has also been dealing with intermittent rectal bleeding of bright red blood. She has been seen outpatient by gastroenterology and has had a colonoscopy. She states that they removed a polyp and told her that she had internal hemorrhoids causing her bleeding. Patient states that she has been worried about it because over the past week. Her rectal bleeding has seemed to increase. She denies urinary symptoms. She denies any fevers, chills, myalgias, arthralgias, or other signs of systemic infection. The patient denies changes in her stool caliber. She has not traveled outside of the country in more than 7 years.  Past Medical History  Diagnosis Date  . High cholesterol   . Hypercholesterolemia     takes meds when not pregnant  . No pertinent past medical history   . Normal pregnancy, repeat 09/15/2010  . SVD (spontaneous vaginal delivery) 09/15/2010  . Gestational diabetes   . Anxiety   . Depression   . Carpal tunnel syndrome   .  Hemorrhoids   . Dysrhythmia     palpitations with anxiety attacks  . Gestational diabetes requiring insulin 02/20/2012   Past Surgical History  Procedure Laterality Date  . No past surgeries     Family History  Problem Relation Age of Onset  . Cancer Mother     brain  . Hypertension Mother   . Hypertension Father   . Anxiety disorder Sister   . Depression Sister   . Hypertension Sister   . Diabetes Brother    Social History  Substance Use Topics  . Smoking status: Never Smoker   . Smokeless tobacco: Never Used  . Alcohol Use: No   OB History    Gravida Para Term Preterm AB TAB SAB Ectopic Multiple Living   5 4 4  0 1 0 1 0 0 4     Review of Systems  Ten systems reviewed and are negative for acute change, except as noted in the HPI.    Allergies  Amoxicillin and Penicillins  Home Medications   Prior to Admission medications   Medication Sig Start Date End Date Taking? Authorizing Provider  FLUoxetine (PROZAC) 40 MG capsule Take 40 mg by mouth daily.   Yes Historical Provider, MD  gemfibrozil (LOPID) 600 MG tablet Take 600 mg by mouth 2 (two) times daily before a meal.   Yes Historical Provider, MD  benzonatate (TESSALON) 100 MG capsule Take 1 capsule (100 mg total) by mouth every 8 (eight) hours. Patient not taking: Reported on 12/24/2014 06/14/13   Caryn BeeKevin  Campos, MD  citalopram (CELEXA) 10 MG tablet Take 5 mg by mouth daily.    Historical Provider, MD  fenofibrate (TRICOR) 48 MG tablet Take 48 mg by mouth daily.  06/06/13   Historical Provider, MD  ibuprofen (ADVIL,MOTRIN) 200 MG tablet Take 200 mg by mouth every 6 (six) hours as needed for fever.    Historical Provider, MD  lisinopril (PRINIVIL,ZESTRIL) 10 MG tablet Take 10 mg by mouth daily.  04/24/13   Historical Provider, MD  phenylephrine (SUDAFED PE) 10 MG TABS tablet Take 10 mg by mouth every 4 (four) hours as needed (for congestion).    Historical Provider, MD   BP 124/79 mmHg  Pulse 59  Temp(Src) 97.9 F (36.6  C) (Oral)  Resp 17  Ht 5' (1.524 m)  Wt 55.339 kg  BMI 23.83 kg/m2  SpO2 98%  LMP 12/24/2014 Physical Exam  Constitutional: She is oriented to person, place, and time. She appears well-developed and well-nourished. No distress.  HENT:  Head: Normocephalic and atraumatic.  Eyes: Conjunctivae are normal. No scleral icterus.  Neck: Normal range of motion.  Cardiovascular: Normal rate, regular rhythm and normal heart sounds.  Exam reveals no gallop and no friction rub.   No murmur heard. Pulmonary/Chest: Effort normal and breath sounds normal. No respiratory distress.  Abdominal: Soft. Bowel sounds are normal. She exhibits no distension and no mass. There is no tenderness. There is no guarding.  Neurological: She is alert and oriented to person, place, and time.  Skin: Skin is warm and dry. She is not diaphoretic.  Nursing note and vitals reviewed.   ED Course  Procedures (including critical care time) Labs Review Labs Reviewed  COMPREHENSIVE METABOLIC PANEL - Abnormal; Notable for the following:    CO2 20 (*)    Glucose, Bld 137 (*)    Total Protein 8.3 (*)    All other components within normal limits  URINALYSIS, ROUTINE W REFLEX MICROSCOPIC (NOT AT [facility]) - Abnormal; Notable for the following:    Hgb urine dipstick LARGE (*)    Leukocytes, UA TRACE (*)    All other components within normal limits  URINE MICROSCOPIC-ADD ON - Abnormal; Notable for the following:    Squamous Epithelial / LPF 0-5 (*)    Bacteria, UA RARE (*)    All other components within normal limits  LIPASE, BLOOD  CBC  I-STAT BETA HCG BLOOD, ED ([facility], [facility], [facility] ONLY)  POCT CBG (FASTING - GLUCOSE)-MANUAL ENTRY    Imaging Review No results found. I have personally reviewed and evaluated these images and lab results as part of my medical decision-making.   EKG Interpretation None      ED ECG REPORT   Date: 12/24/2014  Rate: 63  Rhythm: normal sinus rhythm  QRS Axis: normal  Intervals: normal  ST/T  Wave abnormalities: normal  Conduction Disutrbances:none  Narrative Interpretation:   Old EKG Reviewed:   I have personally reviewed the EKG tracing and agree with the computerized printout as noted.   MDM   Final diagnoses:  None    Patient with complaint of presyncope, rectal bleeding.  . She has a hemoglobin of 13. Negative orthostatics. EKG unremarkable  Orthostatic Lying  - BP- Lying: 137/81 mmHg ; Pulse- Lying: 62  Orthostatic Sitting - BP- Sitting: 155/87 mmHg ; Pulse- Sitting: 63  Orthostatic Standing at 0 minutes - BP- Standing at 0 minutes: 147/93 mmHg ; Pulse- Standing at 0 minutes: 60  . Patient symptoms treated. She has hematuria which I  believe is coming from the rectal bleeding.  She is able to ambulate easily. I do not feel that her dizziness represents symptomatic anemia. I have discussed the case with the attending physician. The patient is safe for discharge at this time. Likely diagnosis of gastritis. Gastric since to seek immediate medical care. Repeat abdominal examination is benign. She appears safe for discharge at this time. I've asked her to follow up with PCP this week for recheck of her hemoglobin.    Arthor Captain, PA-C 12/24/14 1635  Lyndal Pulley, MD 12/25/14 [phone]

## 2014-12-24 NOTE — Discharge Instructions (Signed)
Vim D? Dy, Ng??i L?n (Gastritis, Adult) Vim d? dy l hi?n t??ng ?au nh?c v s?ng (vim) ? l?p nim m?c d? dy. Vim d? dy c th? pht tri?n nh? l m?t tnh tr?ng kh?i pht ??t ng?t (c?p tnh) ho?c ko di (m?n tnh). N?u vim d? dy khng ???c ?i?u tr?, b?nh c th? d?n ??n ch?y mu v lot d? dy.  NGUYN NHN Vim d? dy xu?t hi?n khi l?p nim m?c d? dy y?u ho?c b? t?n th??ng. Sau ?, d?ch tiu ha t? d? dy gy vim nim m?c d? dy b? suy y?u. Nim m?c d? dy c th? y?u ho?c b? t?n th??ng do nhi?m vi rt ho?c vi khu?n. M?t nhi?m trng do vi khu?n ph? bi?n l nhi?m trng do Helicobacter pylori. Vim d? dy c?ng c th? do u?ng r??u qu nhi?u, dng m?t s? lo?i thu?c nh?t ??nh ho?c c qu nhi?u axit trong d? dy. TRI?U CH?NG Trong m?t s? tr??ng h?p, khng c tri?u ch?ng. Khi c tri?u ch?ng, chng c th? bao g?m:  ?au ho?c c?m gic nng rt ? vng b?ng trn.  Bu?n nn.  Nn.  C?m gic kh ch?u do no sau khi ?n. CH?N ?ON Chuyn gia ch?m [place] s?c kh?e c th? nghi ng? b?n b? vim d? dy d?a vo cc tri?u ch?ng c?a b?n v khm th?c th?. ?? xc ??nh nguyn nhn vim d? dy, chuyn gia ch?m [place] s?c kh?e c th? th?c hi?n nh? sau:  Xt nghi?m mu ho?c phn ?? ki?m tra vi khu?n H pylori.  Soi d? dy. M?t ?ng m?ng, m?m (?n n?i soi) ???c lu?n xu?ng th?c qu?n v vo d? dy. ?n n?i soi c g?n ?n v camera ? ??u. Chuyn gia ch?m [place] s?c kh?e s? d?ng ?n n?i soi ?? xem bn trong d? dy.  L?y m?u m (sinh thi?t) t? d? dy ?? ki?m tra d??i knh hi?n vi. ?I?U TR? Ty thu?c vo nguyn nhn gy vim d? dy, thu?c c th? ???c k ??n. N?u b?n b? nhi?m trng do vi khu?n, ch?ng h?n nh? nhi?m trng do H pylori, khng sinh c th? ???c cho dng. N?u vim d? dy l do c qu nhi?u axit trong d? dy, thu?c ch?n H2 ho?c thu?c trung ha axit c th? ???c cho dng. Chuyn gia ch?m [place] s?c kh?e c th? khuy?n ngh? b?n nn ng?ng dng atpirin, ibuprofen ho?c thu?c khng vim khng c steroid khc (NSAID). H??NG D?N CH?M  [place] T?I NH  Ch? s? d?ng thu?c khng c?n k toa ho?c thu?c c?n k toa theo ch? d?n c?a chuyn gia ch?m  s?c kh?e.  N?u b?n ???c cho dng thu?c khng sinh, hy dng theo ch? d?n. Dng h?t thu?c ngay c? khi b?n b?t ??u c?m th?y ?? h?n.  U?ng ?? n??c ?? gi? cho n??c ti?u trong ho?c vng nh?t.  Hessie Diener cc lo?i th?c ph?m v ?? u?ng lm cho cc tri?u ch?ng t?i t? h?n, ch?ng h?n nh?:  Caffeine ho?c ?? u?ng c c?n.  S c la.  B?c h ho?c v? b?c h.  T?i v hnh ty.  Th?c ?n [place].  Tri cy h? cam, ch?ng h?n nh? cam, chanh hay chanh ty.  Cc th?c ?n c c chua, ch?ng h?n nh? n??c x?t, ?t, salsa (n??c x?t [place]) v bnh pizza.  Cc lo?i th?c ?n chin xo v nhi?u ch?t bo.  ?n nh?ng b?a ?n nh?, th??ng xuyn h?n thay v cc b?a ?n l?n. HY NGAY  L?P T?C ?I KHM N?U:  Phn c mu ?en ho?c ?? s?m.  B?n nn ra mu ho?c ch?t gi?ng nh? b?t c ph.  B?n khng th? gi? ch?t l?ng trong ng??i.  B?n b? ?au b?ng tr?m tr?ng h?n.  B?n b? s?t.  B?n khng c?m th?y kh?e h?n sau 1 tu?n.  B?n c b?t c? cu h?i ho?c th?c m?c no khc. ??M B?O B?N:  Hi?u cc h??ng d?n ny.  S? theo di tnh tr?ng c?a mnh.  S? yu c?u tr? gip ngay l?p t?c n?u b?n c?m th?y khng ?? ho?c tnh tr?ng tr?m tr?ng h?n.   Thng tin ny khng nh?m m?c ?ch thay th? cho l?i khuyn m chuyn gia ch?m [place] s?c kh?e ni v?i qu v?. Hy b?o ??m qu v? ph?i th?o lu?n b?t k? v?n ?? g m qu v? c v?i chuyn gia ch?m [place] s?c kh?e c?a qu v?.   Document Released: 01/19/2005 Document Revised: 09/21/2012 Elsevier Interactive Patient Education [employer]  B?nh Tr? (Hemorrhoids) Tr? l cc t?nh m?ch b? s?ng xung quanh tr?c trng ho?c h?u mn. C hai lo?i tr?:  Tr? n?i. Lo?i ny xu?t hi?n trong cc t?nh m?ch ngay bn trong tr?c trng. Chng c th? ch?c xuyn qua bn ngoi v b? kch thch v ?au.  Tr? ngo?i. Lo?i ny xu?t hi?n ? cc t?nh m?ch bn ngoi h?u mn v c th? c?m th?y s?ng ?au ho?c m?t c?c s?ng c?ng g?n  h?u mn. NGUYN NHN  [place].  Bo ph.  To bn ho?c tiu ch?y.  R?n ?? ??i ti?n.  Ng?i lu trong nh v? sinh.  Nng v?t n?ng ho?c ho?t ??ng khc khi?n b?n c?ng th?ng.  Giao h?p qua ???ng h?u mn. TRI?U CH?NG  ?au.  Ng?a ho?c kch thch h?u mn.  Ch?y mu tr?c trng.  R r? phn.  S?ng h?u mn.  M?t ho?c nhi?u c?c xung quanh h?u mn. CH?N ?ON Chuyn gia ch?m [place] s?c kh?e c th? ch?n ?on b?nh tr? b?ng cch khm b?ng m?t th??ng. Cc ki?m tra v xt nghi?m khc c th? ???c th?c hi?n bao g?m:  Ki?m tra khu v?c tr?c trng b?ng tay ?eo g?ng (ki?m tra tr?c trng b?ng ngn tay).  Ki?m tra ?ng h?u mn b?ng cch s? d?ng m?t ?ng nh? (?ng soi).  Xt nghi?m mu n?u b?n b? m?t m?t l??ng mu ?ng k?.  M?t ki?m tra ?? xem bn trong ??i trng (soi ??i trng sigma ho??c soi ??i trng). ?I?U TR? H?u h?t b?nh tr? c th? ???c ?i?u tr? t?i nh. Tuy nhin, n?u cc tri?u ch?ng c v? khng kh h?n ho?c n?u b?n b? ch?y mu tr?c trng nhi?u, chuyn gia ch?m [place] s?c kh?e c th? th?c hi?n m?t th? thu?t ?? gip lm cho tr? nh? h?n ho?c lo?i b? chng hon ton. Cc ph??ng php ?i?u tr? c th? bao g?m:  ??t m?t b?ng cao su t?i chn tr? ?? c?t ??t tu?n hon mu (th?t ??ng m?ch b?ng b?ng cao su).  Tim ha ch?t ?? thu nh? tr? (li?u php x? ho).  S? d?ng m?t d?ng c? ?? ??t tr? (li?u php nh sng h?ng ngo?i).  Ph?u thu?t lo?i b? tr? (th? thu?t c?t tr?).  K?p tr? ?? ch?n dng mu ??n m (k?p tr?). H??NG D?N CH?M [place] T?I NH  ?n th?c ?n c nhi?u ch?t x? nh? ng? c?c, ??u, cc lo?i h?t, tri cy v rau. Hy h?i bc s? v?  vi?c dng cc s?n ph?m c b? sung ch?t x? (b? sung ch?t x?).  U?ng nhi?u n??c h?n. U?ng ?? n??c v dung d?ch ?? n??c ti?u trong ho?c c mu vng nh?t.  T?p th? d?c th??ng xuyn.  ?i v? sinh khi b?n c nhu c?u. Khng ch?.  Trnh r?n khi ?i ??i ti?n.  Gi? vng h?u mn kh v s?ch s?. S? d?ng gi?y v? sinh ??t ho?c kh?n gi?y ?m sau khi ??i ti?n.  Thu?c kem v thu?c ??n  c th? ???c s? d?ng ho?c p d?ng theo ch? d?n.  Ch? s? d?ng thu?c khng c?n k toa ho?c thu?c c?n k toa theo ch? d?n c?a chuyn gia ch?m [place] s?c kh?e.  T?m ng?i trong n??c ?m kho?ng 15-20 pht, 3-4 l?n m?t ngy ?? gi?m ?au v kh ch?u.  Ch??m ? l?nh vo ch? tr? n?u n nh?y c?m ?au v s?ng ln. Vi?c s? d?ng cc gi ch??m ? gi?a cc l?n t?m ng?i c th? h?u ch.  Cho ? l?nh vo ti nh?a.  ??t kh?n t?m gi?a da v ti.  ?? ? l?nh kho?ng 15-20 pht, 3-4 l?n m?t ngy.  Khng s? d?ng g?i hnh bnh rn ho?c ng?i lu trn b?n c?u. ?i?u ny lm t?ng tch t? mu v ?au. HY ?I KHM N?U:  B?n b? ?au v s?ng t?ng ln khng ki?m sot ???c b?ng ?i?u tr? ho?c thu?c.  B?n b? ch?y mu khng ki?m sot ???c.  B?n g?p kh kh?n ho?c khng th? ?i ??i ti?n.  B?n b? ?au ho?c vim nhi?m bn ngoi khu v?c tr?[name] Kitchen ??M B?O B?N:  Hi?u cc h??ng d?n ny.  S? theo di tnh tr?ng c?a mnh.  S? yu c?u tr? gip ngay l?p t?c n?u b?n c?m th?y khng ?? ho?c tnh tr?ng tr?m tr?ng h?n.   Thng tin ny khng nh?m m?c ?ch thay th? cho l?i khuyn m chuyn gia ch?m [place] s?c kh?e ni v?i qu v?. Hy b?o ??m qu v? ph?i th?o lu?n b?t k? v?n ?? g m qu v? c v?i chuyn gia ch?m [place] s?c kh?e c?a qu v?.   Document Released: 10/29/2004 Document Revised: 09/21/2012 Elsevier Interactive Patient Education 2016 Elsevier Inc.  Ng?t (Syncope) Ng?t l m?t thu?t ng? y h?c ?? ch? ng?t ho?c b?t t?nh. ?i?u ny c ngh?a l qu v? m?t  th?c v ng xu?ng ??t. Ng??i ta th??ng b?t t?nh trong th?i gian d??i 5 pht. Qu v? c th? c m?t s? l?n co gi?t c? ln ??n 15 giy tr??c khi t?nh v tr? l?i bnh th??ng. Ng?t x?y ra th??ng xuyn h?n ? nh?ng ng??i cao tu?i, nh?ng n c th? x?y ra v?i b?t c? ai. Trong khi h?u h?t nguyn nhn ng?t l khng nguy hi?m, ng?t c th? l d?u hi?u c?a m?t v?n ?? s?c kh?e nghim tr?ng. ?i?u quan tr?ng l c?n ???c ch?m [place] y t?.  NGUYN NHN  Ng?t l do dng mu ln no b? gi?m ??t ng?t. Nguyn nhn c? th?  th??ng khng ???c xc ??nh. Cc y?u t? c th? gy ra ng?t bao g?m:  Dng thu?c h? huy?t p.  Thay ??i t? th? ??t ng?t, ch?ng h?n nh? ??ng ln nhanh.  Dng nhi?u thu?c h?n k ??n.  ??ng ? m?t n?i qu lu.  B?nh ??ng kinh.  M?t n??c v ti?p xc qu nhi?u v?i nhi?t.  L??ng ???ng trong mu th?p (h? ???ng huy?t).  Ph?i r?n m?nh ?? ??i ti?n.  B?nh  tim, nh?p tim khng ??u ho?c cc v?n ?? tu?n hon khc.  S? hi, ?au kh? v? tnh th?n, nhn th?y mu ho?c ?au r?t nhi?u. TRI?U CH?NG  Ngay tr??c khi ng?t, qu v? c th?:  C?m th?y chng m?t ho?c chong vng.  C?m th?y bu?n nn.  Nhn th?y ton mu tr?ng ho?c ton mu ?en trong t?m nhn c?a qu v?.  Da l?nh, ?m. CH?N ?ON  Chuyn gia ch?m [place] s?c kh?e s? h?i v? cc tri?u ch?ng, khm th?c th?, v lm ?i?n tm ?? (ECG) ?? ghi l?i ho?t ??ng ?i?n c?a tim qu v?. Chuyn gia ch?m [place] s?c kh?e c?ng c th? khm tim ho?c lm cc xt nghi?m mu khc ?? xc ??nh nguyn nhn gy ng?t, c th? bao g?m:  Siu m tim qua thnh ng?c (TTE). Trong siu m tim, sng m thanh ???c s? d?ng ?? ?nh gi mu l?u thng qua tim nh? th? no.  Siu m tim qua th?c qu?n (TEE).  Theo di tim. Bi?n php ny cho php chuyn gia ch?m [place] s?c kh?e theo di nh?p v nh?p ?i?u c?a tim theo th?i gian th?c.  My ?i?n tim. ?y l m?t thi?t b? c?m tay ghi l?i nh?p tim v c th? gip ch?n ?on r?i lo?n nh?p tim. N cho php chuyn gia ch?m [place] s?c kh?e theo di ho?t ??ng c?a tim trong vi ngy n?u c?n.  Nghi?m php g?ng s?c b?ng cch t?p th? d?c ho?c b?ng cch s? d?ng thu?c lm cho tim ??p nhanh h?n. ?I?U TR?  H?u h?t cc tr??ng h?p khng c?n ?i?u tr?[name] Kitchen Ty thu?c vo nguyn nhn gy ng?t, chuyn gia ch?m [place] s?c kh?e c th? [place] qu v? thay ??i ho?c d?ng m?t s? cc lo?i thu?c c?a qu v?. H??NG D?N CH?M [place] T?I NH  Nh? ai ? ? l?i cng qu v? cho ??n khi qu v? c?m th?y ?n ??nh.  Khng li xe, s? d?ng my mc, ho?c ch?i th? thao cho ??n khi chuyn gia ch?m [place] s?c kh?e ni  c th? th?c hi?n ???c ?i?u ?.  Tun th? m?i cu?c h?n khm l?i theo ch? d?n c?a chuyn gia ch?m [place] s?c kh?e.  N?m xu?ng ngay l?p t?c n?u qu v? b?t ??u c?m th?y nh? qu v? c th? ng?t. Ht th? su v ??u. Ch? cho ??n khi t?t c? cc tri?u ch?ng qua ?i.  U?ng ?? n??c ?? gi? cho n??c ti?u trong ho?c vng nh?t.  N?u qu v? ?ang dng thu?c ?i?u tr? huy?t p ho?c thu?c ?i?u tr? b?nh tim, hy ??ng d?y t? t? v dnh vi pht ?? ng?i r?i sau ? ??ng ln. ?i?u ny c th? gi?m c?m gic chng m?t. NGAY L?P T?C ?I KHM N?U:   Qu v? b? ?au ??u nhi?u.  Qu v? b? ?au b?t th??ng ? ng?c, b?ng ho?c l?ng.  Qu v? b? ch?y mu t? mi?ng ho?c tr?c trng, ho?c ?i ngoi ra phn c mu ?en ho?c mu h?c n.  Qu v? c nh?p tim khng ??u ho?c r?t nhanh.  Qu v? b? ?au khi th?.  Qu v? b? ng?t l?p ?i l?p l?i ho?c co gi?t gi?ng nh? ??ng kinh trong lc ng?t.  Qu v? ng?t khi ng?i ho?c n?m xu?ng.  Qu v? b? l l?n.  Qu v? ?i b? kh kh?n.  Qu v? b? y?u nhi?u.  Qu v? c v?n ?? v? th? l?c. N?u qu v? ng?t, hy g?i cc d?ch  v? c?p c?u ? ??a ph??ng c?a qu v? (911 ? [place] Khng t? li xe ??n b?nh vi?n.    Thng tin ny khng nh?m m?c ?ch thay th? cho l?i khuyn m chuyn gia ch?m [place] s?c kh?e ni v?i qu v?. Hy b?o ??m qu v? ph?i th?o lu?n b?t k? v?n ?? g m qu v? c v?i chuyn gia ch?m [place] s?c kh?e c?a qu v?.   Document Released: 07/21/2011 Document Revised: 06/05/2014 Elsevier Interactive Patient Education 2016 Elsevier Inc.  ?au b?ng, Ng??i l?n (Abdominal Pain, Adult) C nhi?u nguyn nhn d?n ??n ?au b?ng. Thng th??ng ?au b?ng l do m?t b?nh gy ra v s? khng ?? n?u khng ?i?u tr?[name] Kitchen. B?nh ny c th? ???c theo di v ?i?u tr? t?i nh. Chuyn gia ch?m [place] s?c kh?e s? ti?n hnh khm th?c th? v c th? yu c?u lm xt nghi?m mu v ch?p X quang ?? xc ??nh m?c ?? nghim tr?ng c?a c?n ?au b?ng. Tuy nhin, trong nhi?u tr??ng h?p, ph?i m?t nhi?u th?i gian h?n ?? xc ??nh r nguyn nhn gy ?au b?ng.  Tr??c khi tm ra nguyn nhn, chuyn gia ch?m [place] s?c kh?e c th? khng bi?t li?u qu v? c c?n lm thm xt nghi?m ho?c ti?p t?c ?i?u tr? hay khng. H??NG D?N CH?M [place] T?I NH  Theo di c?n ?au b?ng xem c b?t k? thay ??i no khng. Nh?ng hnh ??ng sau c th? gip lo?i b? b?t c? c?m gic kh ch?u no qu v? ?ang b?.  Ch? s? d?ng thu?c khng c?n k ??n ho?c thu?c c?n k ??n theo ch? d?n c?a chuyn gia ch?m [place] s?c kh?e.  Khng dng thu?c nhu?n trng tr? khi ???c chuyn gia ch?m [place] s?c kh?e ch? ??nh.  Th? dng m?t ch? ?? ?n l?ng (n??c lu?c th?t, tr, ho?c n??c) theo ch? d?n c?a chuyn gia ch?m [place] s?c kh?e. Chuy?n d?n sang m?t ch? ?? ?n nh? n?u ch?u ???c. ?I KHM N?U:  Qu v? b? ?au b?ng khng r nguyn nhn.  Qu v? b? ?au b?ng km theo bu?n nn ho?c tiu ch?y.  Qu v? b? ?au khi ?i ti?u ho?c ?i ngoi.  Qu v? b? c?n ?au b?ng lm th?c gi?c vo ban ?m.  Qu v? b? ?au b?ng n?ng thm ho?c ?? h?n khi ?n.  Qu v? b? ?au b?ng n?ng thm khi ?n ?? ?n nhi?u ch?t bo.  Qu v? b? s?t. NGAY L?P T?C ?I KHM N?U:   C?n ?au khng kh?i trong vng 2 gi?Ladell Heads.  Qu v? v?n ti?p t?c b? i (nn m?a).  Ch? c th? c?m th?y ?au ? m?t s? ph?n b?ng, ch?ng h?n nh? ? ph?n b?ng bn ph?i ho?c bn d??i tri.  Phn c?a qu v? c mu ho?c c mu ?en nh? h?c n. ??M B?O QU V?:  Hi?u r cc h??ng d?n ny.  S? theo di tnh tr?ng c?a mnh.  S? yu c?u tr? gip ngay l?p t?c n?u qu v? c?m th?y khng kh?e ho?c th?y tr?m tr?ng h?n.   Thng tin ny khng nh?m m?c ?ch thay th? cho l?i khuyn m chuyn gia ch?m [place] s?c kh?e ni v?i qu v?. Hy b?o ??m qu v? ph?i th?o lu?n b?t k? v?n ?? g m qu v? c v?i chuyn gia ch?m [place] s?c kh?e c?a qu v?.   Document Released: 01/19/2005 Document Revised: 02/09/2014 Elsevier Interactive Patient Education [employer] Elsevier Inc.

## 2014-12-24 NOTE — ED Notes (Signed)
PT placed in gown and in bed. Pt monitored by pulse ox, bp cuff, and 5-lead. 

## 2014-12-24 NOTE — ED Notes (Signed)
Pt reports waking up this am not feeling well. Reports eating [employer] recently and having indigestion since. This am had onset of severe abd pain and n/v, reports vomiting blood and near syncope.

## 2015-02-14 ENCOUNTER — Other Ambulatory Visit: Payer: Self-pay | Admitting: Gastroenterology

## 2015-02-14 DIAGNOSIS — R1013 Epigastric pain: Secondary | ICD-10-CM

## 2015-03-01 ENCOUNTER — Ambulatory Visit (HOSPITAL_COMMUNITY)
Admission: RE | Admit: 2015-03-01 | Discharge: 2015-03-01 | Disposition: A | Discharge status: Home / Self Care | Payer: BLUE CROSS/BLUE SHIELD | Source: Ambulatory Visit | Attending: Gastroenterology | Admitting: Gastroenterology

## 2015-03-01 DIAGNOSIS — R110 Nausea: Secondary | ICD-10-CM | POA: Insufficient documentation

## 2015-03-01 DIAGNOSIS — R1013 Epigastric pain: Secondary | ICD-10-CM | POA: Insufficient documentation

## 2015-03-01 DIAGNOSIS — K760 Fatty (change of) liver, not elsewhere classified: Secondary | ICD-10-CM | POA: Diagnosis not present

## 2015-03-01 MED ORDER — TECHNETIUM TC 99M MEBROFENIN IV KIT
5.0000 | PACK | Freq: Once | INTRAVENOUS | Status: AC | PRN
  Administered 2015-03-01: 5 via INTRAVENOUS

## 2015-05-15 DIAGNOSIS — G930 Cerebral cysts: Secondary | ICD-10-CM

## 2015-05-15 HISTORY — DX: Cerebral cysts: G93.0

## 2015-06-11 ENCOUNTER — Ambulatory Visit: Payer: BLUE CROSS/BLUE SHIELD | Admitting: Neurology

## 2015-06-25 ENCOUNTER — Encounter: Payer: Self-pay | Admitting: Neurology

## 2015-06-25 ENCOUNTER — Ambulatory Visit (INDEPENDENT_AMBULATORY_CARE_PROVIDER_SITE_OTHER): Payer: BLUE CROSS/BLUE SHIELD | Admitting: Neurology

## 2015-06-25 VITALS — BP 130/80 | HR 83 | Ht 63.0 in | Wt 121.0 lb

## 2015-06-25 DIAGNOSIS — M792 Neuralgia and neuritis, unspecified: Secondary | ICD-10-CM | POA: Diagnosis not present

## 2015-06-25 DIAGNOSIS — F411 Generalized anxiety disorder: Secondary | ICD-10-CM | POA: Insufficient documentation

## 2015-06-25 DIAGNOSIS — F329 Major depressive disorder, single episode, unspecified: Secondary | ICD-10-CM | POA: Diagnosis not present

## 2015-06-25 DIAGNOSIS — F32A Depression, unspecified: Secondary | ICD-10-CM | POA: Insufficient documentation

## 2015-06-25 MED ORDER — GABAPENTIN 300 MG PO CAPS: 300.0000 mg | ORAL_CAPSULE | Freq: Three times a day (TID) | ORAL | Status: DC

## 2015-06-25 NOTE — Patient Instructions (Addendum)
Remember to drink plenty of fluid, eat healthy meals and do not skip any meals. Try to eat protein with a every meal and eat a healthy snack such as fruit or nuts in between meals. Try to keep a regular sleep-wake schedule and try to exercise daily, particularly in the form of walking, 20-30 minutes a day, if you can.   As far as your medications are concerned, I would like to suggest: Gabapentin(neurontin) 300mg  three times a day  As far as diagnostic testing: Lab  Our phone number is [phone]. We also have an after hours call service for urgent matters and there is a physician on-call for urgent questions. For any emergencies you know to call 911 or go to the nearest emergency room

## 2015-06-25 NOTE — Progress Notes (Signed)
[name]    Provider:  Dr [name] Referring Provider: Verlon Au, MD Primary Care Physician:  Verlon Au, MD  CC:  headache  HPI:  [name] is a 45 y.o. female here as a referral from Dr. Leavy Cella for headache. PMHx depression, anxiety. She has a headache on the left side of the head. Also on the right side but not as much as the left. Started 2 months ago. She has the headache multiple times a week. She also has memory problems. The headache feels electric and vibrating like something crawling on her head like an insect. The headaches are a few times a week. It has gotten better on and off. She describes a lot of anxiety, headaches are worse when she is upset or anxious. Headache comes and goes. If she feels sad, she feels like something in her head. The pain has been mild in the last week. But it can be severe. She has pain on the right at one point (point to the right upper fronto-parietal junction) an dit hurts when she pushes down on that spot. The headache on the left radiates fronm above the eye to the left occipital rea with pain in the neck. She has sound sensitivity but not light sensitivity. Has some nausea. But no vomiting. No other focal neurologic deficits. Reviewed images of the brain with patient including benign cyst.  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  Ventricular size and CSF spaces normal. There is calcification in the right basal ganglia. No evidence for acute infarct, hemorrhage, or mass lesion. No extra-axial fluid collections or midline shift. Calvarium intact. No fluid in the sinuses visualized.  IMPRESSION: No acute or significant findings.   CT of the head 03/1009(personally reviewed images and agree with the following): Comparison: None  Findings: Ventricular size and CSF spaces normal. There is  calcification in the right basal ganglia. No evidence for acute infarct, hemorrhage, or mass lesion. No extra-axial fluid collections or midline shift. Calvarium intact. No fluid in the sinuses visualized.  IMPRESSION: No acute or significant findings.   Review of Systems: Patient complains of symptoms per HPI as well as the following symptoms: weight gain, memory loss, headache, numbness, anxiety, runny nose. Pertinent negatives per HPI. All others negative.   Social History   Social History  . Marital Status: Married    Spouse Name: Einar Grad  . Number of Children: 3  . Years of Education: 12   Occupational History  . [employer]    Social History Main Topics  . Smoking status: Never Smoker   . Smokeless tobacco: Never Used  . Alcohol Use: No  . Drug Use: No  . Sexual Activity: Yes    Birth Control/ Protection: None   Other Topics Concern  . Not on file   Social History Narrative    Family History  Problem Relation Age of Onset  . Cancer Mother     brain  . Hypertension Mother   . Hypertension Father   . Anxiety disorder Sister   . Depression Sister   . Hypertension Sister   . Diabetes Brother   . Migraines Neg Hx     Past Medical History  Diagnosis Date  . High cholesterol   . Hypercholesterolemia     takes meds when not pregnant  . No pertinent past medical history   . Normal pregnancy,  repeat 09/15/2010  . SVD (spontaneous vaginal delivery) 09/15/2010  . Gestational diabetes   . Anxiety   . Depression   . Carpal tunnel syndrome   . Hemorrhoids   . Dysrhythmia     palpitations with anxiety attacks  . Gestational diabetes requiring insulin 02/20/2012    Past Surgical History  Procedure Laterality Date  . No past surgeries      Current Outpatient Prescriptions  Medication Sig Dispense Refill  . FLUoxetine (PROZAC) 40 MG capsule Take 40 mg by mouth daily.    [name] Kitchen gemfibrozil (LOPID) 600 MG tablet Take 600 mg by mouth 2 (two) times daily  before a meal.    . losartan (COZAAR) 50 MG tablet Take 50 mg by mouth daily.    . pantoprazole (PROTONIX) 20 MG tablet Take 1 tablet (20 mg total) by mouth daily. 30 tablet 0  . gabapentin (NEURONTIN) 300 MG capsule Take 1 capsule (300 mg total) by mouth 3 (three) times daily. 90 capsule 11   No current facility-administered medications for this visit.    Allergies as of 06/25/2015 - Review Complete 06/25/2015  Allergen Reaction Noted  . Amoxicillin Rash 08/21/2010  . Penicillins Rash 09/15/2010    Vitals: BP 130/80 mmHg  Pulse 83  Ht 5\' 3"  (1.6 m)  Wt 121 lb (54.885 kg)  BMI 21.44 kg/m2 Last Weight:  Wt Readings from Last 1 Encounters:  06/25/15 121 lb (54.885 kg)   Last Height:   Ht Readings from Last 1 Encounters:  06/25/15 5\' 3"  (1.6 m)    Physical exam: Exam: Gen: NAD, conversant, well nourised, well groomed                     CV: RRR, no MRG. No Carotid Bruits. No peripheral edema, warm, nontender Eyes: Conjunctivae clear without exudates or hemorrhage  Neuro: Detailed Neurologic Exam  Speech:    Speech is normal; fluent and spontaneous with normal comprehension.  Cognition:    The patient is oriented to person, place, and time;     recent and remote memory intact;     language fluent;     normal attention, concentration,     fund of knowledge Cranial Nerves:    The pupils are equal, round, and reactive to light. The fundi are normal and spontaneous venous pulsations are present. Visual fields are full to finger confrontation. Extraocular movements are intact. Trigeminal sensation is intact and the muscles of mastication are normal. The face is symmetric. The palate elevates in the midline. Hearing intact. Voice is normal. Shoulder shrug is normal. The tongue has normal motion without fasciculations.   Coordination:    Normal finger to nose and heel to shin. Normal rapid alternating movements.   Gait:    Heel-toe and tandem gait are normal.   Motor  Observation:    No asymmetry, no atrophy, and no involuntary movements noted. Tone:    Normal muscle tone.    Posture:    Posture is normal. normal erect    Strength:    Strength is V/V in the upper and lower limbs.      Sensation: intact to LT     Reflex Exam:  DTR's:    Deep tendon reflexes in the upper and lower extremities are normal bilaterally.   Toes:    The toes are downgoing bilaterally.   Clonus:    Clonus is absent.      Assessment/Plan:  45 year old with facial neuralgia  Patient no-showed  first appointment, we don't have records. Will request records from Dr. Leavy CellaBoyd. Start neurontin for facial neuralgia. Tid prn. Common side effects dizziness, drowsiness, weakness, tired feeling, nausea, diarrhea, constipation, blurred vision, headache, breast swelling, dry mouth, loss of balance or coordination. Gave hand out from [employer] regarding patient drug information. CMP  Discussed the following: To prevent or relieve headaches, try the following: Cool Compress. Lie down and place a cool compress on your head.  Avoid headache triggers. If certain foods or odors seem to have triggered your migraines in the past, avoid them. A headache diary might help you identify triggers.  Include physical activity in your daily routine. Try a daily walk or other moderate aerobic exercise.  Manage stress. Find healthy ways to cope with the stressors, such as delegating tasks on your to-do list.  Practice relaxation techniques. Try deep breathing, yoga, massage and visualization.  Eat regularly. Eating regularly scheduled meals and maintaining a healthy diet might help prevent headaches. Also, drink plenty of fluids.  Follow a regular sleep schedule. Sleep deprivation might contribute to headaches Consider biofeedback. With this mind-body technique, you learn to control certain bodily functions - such as muscle tension, heart rate and blood pressure - to prevent headaches or reduce headache  pain.    Proceed to emergency room if you experience new or worsening symptoms or symptoms do not resolve, if you have new neurologic symptoms or if headache is severe, or for any concerning symptom.   Cc: dr. Joannie Springsboyd  Tayana Shankle, MD  [facility] [address] [place], [place] [id number]  Phone [phone] Fax [phone]

## 2015-06-26 ENCOUNTER — Telehealth: Payer: Self-pay

## 2015-06-26 LAB — COMPREHENSIVE METABOLIC PANEL
ALBUMIN: 4.3 g/dL (ref 3.5–5.5)
ALK PHOS: 80 IU/L (ref 39–117)
ALT: 45 IU/L — ABNORMAL HIGH (ref 0–32)
AST: 35 IU/L (ref 0–40)
Albumin/Globulin Ratio: 1.3 (ref 1.2–2.2)
BUN / CREAT RATIO: 26 — AB (ref 9–23)
BUN: 14 mg/dL (ref 6–24)
CHLORIDE: 99 mmol/L (ref 96–106)
CO2: 23 mmol/L (ref 18–29)
Calcium: 9.5 mg/dL (ref 8.7–10.2)
Creatinine, Ser: 0.54 mg/dL — ABNORMAL LOW (ref 0.57–1.00)
GFR calc non Af Amer: 117 mL/min/{1.73_m2} (ref >59)
GFR, EST AFRICAN AMERICAN: 135 mL/min/{1.73_m2} (ref >59)
GLOBULIN, TOTAL: 3.4 g/dL (ref 1.5–4.5)
Glucose: 196 mg/dL — ABNORMAL HIGH (ref 65–99)
POTASSIUM: 4.5 mmol/L (ref 3.5–5.2)
SODIUM: 141 mmol/L (ref 134–144)
TOTAL PROTEIN: 7.7 g/dL (ref 6.0–8.5)

## 2015-06-26 NOTE — Telephone Encounter (Signed)
-----   Message from Anson FretAntonia B Ahern, MD sent at 06/26/2015 12:59 PM EDT ----- Glucose was elevated on this test otherwise unremarkable thanks

## 2015-06-26 NOTE — Telephone Encounter (Signed)
Spoke to [name] [name]. I advised her that per Dr. Lucia GaskinsAhern, [name] [name]'s glucose was elevated but everything else was unremarkable. [name] [name] verbalized understanding of these results. [name] [name] had no questions at this time but was encouraged to call back if questions arise.

## 2015-08-29 ENCOUNTER — Telehealth: Payer: Self-pay | Admitting: Neurology

## 2015-08-29 NOTE — Telephone Encounter (Signed)
Patient is calling. Medication gabapentin (NEURONTIN) 300 MG capsule is making the patient dizzy and making her have a sick feeling. Please call to discuss.

## 2015-08-29 NOTE — Telephone Encounter (Signed)
[name] [name], we can try patient on some Nortriptyline. This is first line headache medication. Discuss side effects including teratogenicity, do not Pregnant on this medication and use birth control. Serious side effects can include hypotension, hypertension, syncope, ventricular arrhythmias, QT prolongation and other cardiac side effects, stroke and seizures, ataxia tardive dyskinesias, extrapyramidal symptoms, increased intraocular pressure, leukopenia, thrombocytopenia, hallucinations, suicidality and other serious side effects. Common reactions include drowsiness, dry mouth, dizziness, constipation, blurred vision, palpitations, tachycardia, impaired coordination, increased appetite, nausea vomiting, weakness, confusion, disorientation, restlessness, anxiety and other side effects.  If she agrees, we can start it at 10mg  in the evening. Most commom side effects are sedation in the morning. It can help with insomnia if she has any. Let me know, thanks   (note, checked qtc 452 which is normal in women. Will start very low dose nortriptyline and check ekg at next appointment)

## 2015-08-29 NOTE — Telephone Encounter (Signed)
Dr [name]- please advise Called pt. She started medication last month but only took 2 pills and has stopped medication. Sx have resolved that she was having from medication. She states she is still having headaches intermittently and wondering if she can try a different medication. I offered to make f/u on 8/9 at 10am with Dr [name], but pt declined. She mentioned that they are trying to figure out her husbands insurance. She is wondering if Dr [name] can call something else in for her to try. She had a bad migraine last night and took two advil.

## 2015-08-30 ENCOUNTER — Other Ambulatory Visit: Payer: Self-pay | Admitting: Neurology

## 2015-08-30 MED ORDER — NORTRIPTYLINE HCL 10 MG PO CAPS: 10.0000 mg | ORAL_CAPSULE | Freq: Every day | ORAL | 11 refills | Status: DC

## 2015-08-30 NOTE — Telephone Encounter (Signed)
Dr [name]- can you call in new rx nortriptyline? Pt agreeable to this, thank you  Called and spoke to pt. Relayed Dr [name] message below. Pt in agreement to try nortriptyline. Pt requested Dr [name] send only 10 tablets instead of the whole "bottle" to know if it will help first. I educated pt that this medication takes 4-6 weeks to reach max benefit and to know if it is helping or not. She will not see an immediate change. She has to give it time to start working. Only taking 10 tablets will not be enough to know if the medication helped or not. She verbalized understanding. I went over SE and verified pt insurance. Pt knows to call office if she experiences any SE.

## 2015-11-07 ENCOUNTER — Ambulatory Visit: Payer: BLUE CROSS/BLUE SHIELD | Admitting: Adult Health

## 2015-11-11 ENCOUNTER — Encounter: Payer: Self-pay | Admitting: Adult Health

## 2015-11-25 ENCOUNTER — Ambulatory Visit: Payer: BLUE CROSS/BLUE SHIELD | Admitting: Adult Health

## 2015-12-03 ENCOUNTER — Encounter: Payer: Self-pay | Admitting: Adult Health

## 2015-12-03 ENCOUNTER — Ambulatory Visit (INDEPENDENT_AMBULATORY_CARE_PROVIDER_SITE_OTHER): Payer: Self-pay | Admitting: Adult Health

## 2015-12-03 VITALS — BP 143/79 | HR 71 | Ht 63.0 in | Wt 125.6 lb

## 2015-12-03 DIAGNOSIS — M792 Neuralgia and neuritis, unspecified: Secondary | ICD-10-CM

## 2015-12-03 DIAGNOSIS — R51 Headache: Secondary | ICD-10-CM

## 2015-12-03 DIAGNOSIS — R519 Headache, unspecified: Secondary | ICD-10-CM

## 2015-12-03 MED ORDER — TOPIRAMATE 25 MG PO TABS: 25.0000 mg | ORAL_TABLET | Freq: Every day | ORAL | 11 refills | Status: DC

## 2015-12-03 NOTE — Patient Instructions (Signed)
Try Topamax 25 mg at bedtime If your symptoms worsen or you develop new symptoms please let us know.   Topiramate tablets What is this medicine? TOPIRAMATE (toe PYRE a mate) is used to treat seizures in adults or children with epilepsy. It is also used for the prevention of migraine headaches. This medicine may be used for other purposes; ask your health care provider or pharmacist if you have questions. What should I tell my health care provider before I take this medicine? They need to know if you have any of these conditions: -bleeding disorders -cirrhosis of the liver or liver disease -diarrhea -glaucoma -kidney stones or kidney disease -low blood counts, like low white cell, platelet, or red cell counts -lung disease like asthma, obstructive pulmonary disease, emphysema -metabolic acidosis -on a ketogenic diet -schedule for surgery or a procedure -suicidal thoughts, plans, or attempt; a previous suicide attempt by you or a family member -an unusual or allergic reaction to topiramate, other medicines, foods, dyes, or preservatives -pregnant or trying to get pregnant -breast-feeding How should I use this medicine? Take this medicine by mouth with a glass of water. Follow the directions on the prescription label. Do not crush or chew. You may take this medicine with meals. Take your medicine at regular intervals. Do not take it more often than directed. Talk to your pediatrician regarding the use of this medicine in children. Special care may be needed. While this drug may be prescribed for children as young as 412 years of age for selected conditions, precautions do apply. Overdosage: If you think you have taken too much of this medicine contact a poison control center or emergency room at once. NOTE: This medicine is only for you. Do not share this medicine with others. What if I miss a dose? If you miss a dose, take it as soon as you can. If your next dose is to be taken in less than  6 hours, then do not take the missed dose. Take the next dose at your regular time. Do not take double or extra doses. What may interact with this medicine? Do not take this medicine with any of the following medications: -probenecid This medicine may also interact with the following medications: -acetazolamide -alcohol -amitriptyline -aspirin and aspirin-like medicines -birth control pills -certain medicines for depression -certain medicines for seizures -certain medicines that treat or prevent blood clots like warfarin, enoxaparin, dalteparin, apixaban, dabigatran, and rivaroxaban -digoxin -hydrochlorothiazide -lithium -medicines for pain, sleep, or muscle relaxation -metformin -methazolamide -NSAIDS, medicines for pain and inflammation, like ibuprofen or naproxen -pioglitazone -risperidone This list may not describe all possible interactions. Give your health care provider a list of all the medicines, herbs, non-prescription drugs, or dietary supplements you use. Also tell them if you smoke, drink alcohol, or use illegal drugs. Some items may interact with your medicine. What should I watch for while using this medicine? Visit your doctor or health care professional for regular checks on your progress. Do not stop taking this medicine suddenly. This increases the risk of seizures if you are using this medicine to control epilepsy. Wear a medical identification bracelet or chain to say you have epilepsy or seizures, and carry a card that lists all your medicines. This medicine can decrease sweating and increase your body temperature. Watch for signs of deceased sweating or fever, especially in children. Avoid extreme heat, hot baths, and saunas. Be careful about exercising, especially in hot weather. Contact your health care provider right away if you notice  a fever or decrease in sweating. You should drink plenty of fluids while taking this medicine. If you have had kidney stones in the  past, this will help to reduce your chances of forming kidney stones. If you have stomach pain, with nausea or vomiting and yellowing of your eyes or skin, call your doctor immediately. You may get drowsy, dizzy, or have blurred vision. Do not drive, use machinery, or do anything that needs mental alertness until you know how this medicine affects you. To reduce dizziness, do not sit or stand up quickly, especially if you are an older patient. Alcohol can increase drowsiness and dizziness. Avoid alcoholic drinks. If you notice blurred vision, eye pain, or other eye problems, seek medical attention at once for an eye exam. The use of this medicine may increase the chance of suicidal thoughts or actions. Pay special attention to how you are responding while on this medicine. Any worsening of mood, or thoughts of suicide or dying should be reported to your health care professional right away. This medicine may increase the chance of developing metabolic acidosis. If left untreated, this can cause kidney stones, bone disease, or slowed growth in children. Symptoms include breathing fast, fatigue, loss of appetite, irregular heartbeat, or loss of consciousness. Call your doctor immediately if you experience any of these side effects. Also, tell your doctor about any surgery you plan on having while taking this medicine since this may increase your risk for metabolic acidosis. Birth control pills may not work properly while you are taking this medicine. Talk to your doctor about using an extra method of birth control. Women who become pregnant while using this medicine may enroll in the [place] Antiepileptic Drug Pregnancy Registry by calling [phone]. This registry collects information about the safety of antiepileptic drug use during pregnancy. What side effects may I notice from receiving this medicine? Side effects that you should report to your doctor or health care professional as soon as  possible: -allergic reactions like skin rash, itching or hives, swelling of the face, lips, or tongue -decreased sweating and/or rise in body temperature -depression -difficulty breathing, fast or irregular breathing patterns -difficulty speaking -difficulty walking or controlling muscle movements -hearing impairment -redness, blistering, peeling or loosening of the skin, including inside the mouth -tingling, pain or numbness in the hands or feet -unusual bleeding or bruising -unusually weak or tired -worsening of mood, thoughts or actions of suicide or dying Side effects that usually do not require medical attention (report to your doctor or health care professional if they continue or are bothersome): -altered taste -back pain, joint or muscle aches and pains -diarrhea, or constipation -headache -loss of appetite -nausea -stomach upset, indigestion -tremors This list may not describe all possible side effects. Call your doctor for medical advice about side effects. You may report side effects to FDA at 1-800-FDA-1088. Where should I keep my medicine? Keep out of the reach of children. Store at room temperature between 15 and 30 degrees C (59 and 86 degrees F) in a tightly closed container. Protect from moisture. Throw away any unused medicine after the expiration date. NOTE: This sheet is a summary. It may not cover all possible information. If you have questions about this medicine, talk to your doctor, pharmacist, or health care provider.    2016, [employer]vier/Gold Standard. (2013-01-23 23:17:57)

## 2015-12-03 NOTE — Progress Notes (Addendum)
PATIENT: [name] [name] DOB: 01/23/1971  REASON FOR VISIT: follow up-headache HISTORY FROM: patient  HISTORY OF PRESENT ILLNESS: Update 12/03/15:  [name] [name] is a 45 year old female with a history of headache/neuralgia. She returns today for follow-up. She reports that she tried nortriptyline for 1 night however it made her too sleepy the next day. She states that she has to work and the medication made her so drowsy that she cannot drive. She reports that she has at least one headache a week. The headache normally occurs on the left side of the head. She describes it as a crawling sensation with sometimes shooting pain. She states that she normally can take Aleve and lay down and the headache will resolve. She states her headaches have been tolerable. She denies any new symptoms. Denies any numbness or tingling in the upper or lower extremities. She is also tried gabapentin in the past but was unable to tolerate this due to drowsiness. She returns today for an evaluation.  HISTORY 06/25/15: [name] [name] is a 45 y.o. female here as a referral from Dr. Leavy Cella for headache. PMHx depression, anxiety. She has a headache on the left side of the head. Also on the right side but not as much as the left. Started 2 months ago. She has the headache multiple times a week. She also has memory problems. The headache feels electric and vibrating like something crawling on her head like an insect. The headaches are a few times a week. It has gotten better on and off. She describes a lot of anxiety, headaches are worse when she is upset or anxious. Headache comes and goes. If she feels sad, she feels like something in her head. The pain has been mild in the last week. But it can be severe. She has pain on the right at one point (point to the right upper fronto-parietal junction) an dit hurts when she pushes down on that spot. The headache on the left radiates fronm above the eye to the left occipital rea with pain in the  neck. She has sound sensitivity but not light sensitivity. Has some nausea. But no vomiting. No other focal neurologic deficits. Reviewed images of the brain with patient including benign cyst.  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  Ventricular size and CSF spaces normal. There is calcification in the right basal ganglia. No evidence for acute infarct, hemorrhage, or mass lesion. No extra-axial fluid collections or midline shift. Calvarium intact. No fluid in the sinuses visualized.  IMPRESSION: No acute or significant findings.   CT of the head 03/1009(personally reviewed images and agree with the following): Comparison: None  Findings: Ventricular size and CSF spaces normal. There is calcification in the right basal ganglia. No evidence for acute infarct, hemorrhage, or mass lesion. No extra-axial fluid collections or midline shift. Calvarium intact. No fluid in the sinuses visualized.  IMPRESSION: No acute or significant findings  REVIEW OF SYSTEMS: Out of a complete 14 system review of symptoms, the patient complains only of the following symptoms, and all other reviewed systems are negative.  See history of present illness  ALLERGIES: Allergies  Allergen Reactions  . Amoxicillin Rash  . Penicillins Rash    HOME MEDICATIONS: Outpatient Medications Prior to Visit  Medication Sig Dispense Refill  . FLUoxetine (PROZAC) 40 MG capsule Take 40 mg by mouth daily.    [name] Kitchen gemfibrozil (LOPID)  600 MG tablet Take 600 mg by mouth 2 (two) times daily before a meal.    . losartan (COZAAR) 50 MG tablet Take 50 mg by mouth daily.    . pantoprazole (PROTONIX) 20 MG tablet Take 1 tablet (20 mg total) by mouth daily. 30 tablet 0  . gabapentin (NEURONTIN) 300 MG capsule Take 1 capsule (300 mg total) by mouth 3 (three) times daily. (Patient not taking: Reported on  12/03/2015) 90 capsule 11  . nortriptyline (PAMELOR) 10 MG capsule Take 1 capsule (10 mg total) by mouth at bedtime. (Patient not taking: Reported on 12/03/2015) 30 capsule 11   No facility-administered medications prior to visit.     PAST MEDICAL HISTORY: Past Medical History:  Diagnosis Date  . Anxiety   . Carpal tunnel syndrome   . Depression   . Dysrhythmia    palpitations with anxiety attacks  . Gestational diabetes   . Gestational diabetes requiring insulin 02/20/2012  . Hemorrhoids   . High cholesterol   . Hypercholesterolemia    takes meds when not pregnant  . No pertinent past medical history   . Normal pregnancy, repeat 09/15/2010  . SVD (spontaneous vaginal delivery) 09/15/2010    PAST SURGICAL HISTORY: Past Surgical History:  Procedure Laterality Date  . NO PAST SURGERIES      FAMILY HISTORY: Family History  Problem Relation Age of Onset  . Cancer Mother     brain  . Hypertension Mother   . Hypertension Father   . Anxiety disorder Sister   . Depression Sister   . Hypertension Sister   . Diabetes Brother   . Migraines Neg Hx     SOCIAL HISTORY: Social History   Social History  . Marital status: Married    Spouse name: [name]  . Number of children: 3  . Years of education: 12   Occupational History  . [employer]    Social History Main Topics  . Smoking status: Never Smoker  . Smokeless tobacco: Never Used  . Alcohol use No  . Drug use: No  . Sexual activity: Yes    Birth control/ protection: None   Other Topics Concern  . Not on file   Social History Narrative  . No narrative on file      PHYSICAL EXAM  Vitals:   12/03/15 1107  BP: (!) 143/79  Pulse: 71  Weight: 125 lb 9.6 oz (57 kg)  Height: 5\' 3"  (1.6 m)   Body mass index is 22.25 kg/m.  Generalized: Well developed, in no acute distress   Neurological examination  Mentation: Alert oriented to time, place, history taking. Follows all commands speech and language  fluent Cranial nerve II-XII: Pupils were equal round reactive to light. Extraocular movements were full, visual field were full on confrontational test. Facial sensation and strength were normal. Uvula tongue midline. Head turning and shoulder shrug  were normal and symmetric. Motor: The motor testing reveals 5 over 5 strength of all 4 extremities. Good symmetric motor tone is noted throughout.  Sensory: Sensory testing is intact to soft touch on all 4 extremities. No evidence of extinction is noted.  Coordination: Cerebellar testing reveals good finger-nose-finger and heel-to-shin bilaterally.  Gait and station: Gait is normal. Tandem gait is normal. Romberg is negative. No drift is seen.  Reflexes: Deep tendon reflexes are symmetric and normal bilaterally.   DIAGNOSTIC DATA (LABS, IMAGING, TESTING) - I reviewed patient records, labs, notes, testing and imaging myself where available.  Lab Results  Component Value Date   WBC 7.8 12/24/2014   HGB 13.4 12/24/2014   HCT 40.2 12/24/2014   MCV 86.6 12/24/2014   PLT 242 12/24/2014      Component Value Date/Time   NA 141 06/25/2015 1430   K 4.5 06/25/2015 1430   CL 99 06/25/2015 1430   CO2 23 06/25/2015 1430   GLUCOSE 196 (H) 06/25/2015 1430   GLUCOSE 137 (H) 12/24/2014 0941   BUN 14 06/25/2015 1430   CREATININE 0.54 (L) 06/25/2015 1430   CALCIUM 9.5 06/25/2015 1430   PROT 7.7 06/25/2015 1430   ALBUMIN 4.3 06/25/2015 1430   AST 35 06/25/2015 1430   ALT 45 (H) 06/25/2015 1430   ALKPHOS 80 06/25/2015 1430   BILITOT <0.2 06/25/2015 1430   GFRNONAA 117 06/25/2015 1430   GFRAA 135 06/25/2015 1430       ASSESSMENT AND PLAN 45 y.o. year old female  has a past medical history of Anxiety; Carpal tunnel syndrome; Depression; Dysrhythmia; Gestational diabetes; Gestational diabetes requiring insulin (02/20/2012); Hemorrhoids; High cholesterol; Hypercholesterolemia; No pertinent past medical history; Normal pregnancy, repeat (09/15/2010); and  SVD (spontaneous vaginal delivery) (09/15/2010). here with:  1. Neuralgia   The patient's physical exam is relatively unremarkable. She has been unable to tolerate nortriptyline and gabapentin due to drowsiness. I will try the patient on Topamax. She will begin taking 25 mg at bedtime. I have reviewed side effects with the patient. She has had a tubal ligation so risk for pregnancy is decreased. Patient advised that if this is not beneficial for her headaches she will let us know. Follow-up in 3 months or sooner if needed.    Butch PennyMegan Ailen Strauch, MSN, NP-C 12/03/2015, 11:13 AM [facility] [address], [address] [place], [place] [address] [phone]  Personally have participated in and made any corrections needed to history, physical, neuro exam,assessment and plan as stated above.    Naomie DeanAntonia Ahern, MD [facility]

## 2016-02-27 ENCOUNTER — Emergency Department (HOSPITAL_COMMUNITY): Payer: BLUE CROSS/BLUE SHIELD

## 2016-02-27 ENCOUNTER — Encounter (HOSPITAL_COMMUNITY): Payer: Self-pay | Admitting: Emergency Medicine

## 2016-02-27 ENCOUNTER — Emergency Department (HOSPITAL_COMMUNITY)
Admission: EM | Admit: 2016-02-27 | Discharge: 2016-02-27 | Disposition: A | Discharge status: Home / Self Care | Payer: BLUE CROSS/BLUE SHIELD | Attending: Emergency Medicine | Admitting: Emergency Medicine

## 2016-02-27 DIAGNOSIS — R51 Headache: Secondary | ICD-10-CM | POA: Diagnosis not present

## 2016-02-27 DIAGNOSIS — Z79899 Other long term (current) drug therapy: Secondary | ICD-10-CM | POA: Insufficient documentation

## 2016-02-27 DIAGNOSIS — R519 Headache, unspecified: Secondary | ICD-10-CM

## 2016-02-27 DIAGNOSIS — R0789 Other chest pain: Secondary | ICD-10-CM

## 2016-02-27 LAB — CBC
HEMATOCRIT: 32.3 % — AB (ref 36.0–46.0)
HEMOGLOBIN: 10.7 g/dL — AB (ref 12.0–15.0)
MCH: 27 pg (ref 26.0–34.0)
MCHC: 33.1 g/dL (ref 30.0–36.0)
MCV: 81.4 fL (ref 78.0–100.0)
Platelets: 249 10*3/uL (ref 150–400)
RBC: 3.97 MIL/uL (ref 3.87–5.11)
RDW: 14 % (ref 11.5–15.5)
WBC: 8 10*3/uL (ref 4.0–10.5)

## 2016-02-27 LAB — BASIC METABOLIC PANEL
ANION GAP: 10 (ref 5–15)
BUN: 13 mg/dL (ref 6–20)
CALCIUM: 9.3 mg/dL (ref 8.9–10.3)
CO2: 22 mmol/L (ref 22–32)
Chloride: 104 mmol/L (ref 101–111)
Creatinine, Ser: 0.51 mg/dL (ref 0.44–1.00)
Glucose, Bld: 104 mg/dL — ABNORMAL HIGH (ref 65–99)
POTASSIUM: 3.9 mmol/L (ref 3.5–5.1)
SODIUM: 136 mmol/L (ref 135–145)

## 2016-02-27 LAB — I-STAT TROPONIN, ED
TROPONIN I, POC: 0 ng/mL (ref 0.00–0.08)
Troponin i, poc: 0 ng/mL (ref 0.00–0.08)

## 2016-02-27 LAB — I-STAT BETA HCG BLOOD, ED (MC, WL, AP ONLY): I-stat hCG, quantitative: <5 m[IU]/mL (ref <5)

## 2016-02-27 MED ORDER — DIPHENHYDRAMINE HCL 50 MG/ML IJ SOLN
25.0000 mg | Freq: Once | INTRAMUSCULAR | Status: AC
  Administered 2016-02-27: 25 mg via INTRAVENOUS
  Filled 2016-02-27: qty 1

## 2016-02-27 MED ORDER — PROCHLORPERAZINE EDISYLATE 5 MG/ML IJ SOLN
10.0000 mg | Freq: Once | INTRAMUSCULAR | Status: AC
  Administered 2016-02-27: 10 mg via INTRAVENOUS
  Filled 2016-02-27: qty 2

## 2016-02-27 MED ORDER — SODIUM CHLORIDE 0.9 % IV BOLUS (SEPSIS)
500.0000 mL | Freq: Once | INTRAVENOUS | Status: AC
  Administered 2016-02-27: 500 mL via INTRAVENOUS

## 2016-02-27 MED ORDER — METHYLPREDNISOLONE SODIUM SUCC 125 MG IJ SOLR
125.0000 mg | Freq: Once | INTRAMUSCULAR | Status: AC
  Administered 2016-02-27: 125 mg via INTRAVENOUS
  Filled 2016-02-27: qty 2

## 2016-02-27 MED ORDER — LOSARTAN POTASSIUM 50 MG PO TABS
50.0000 mg | ORAL_TABLET | Freq: Once | ORAL | Status: AC
  Administered 2016-02-27: 50 mg via ORAL
  Filled 2016-02-27: qty 1

## 2016-02-27 NOTE — ED Triage Notes (Addendum)
Pt presents to ED for left sided chest pain with no radiation that has been intermittent x 1 week.  Pt c/o increasing SOB and increasing fatigue over this period as well.  Pt also c/o 8/10 HA which patient has been assessed with a CT for in the past and patient states "they told me nothing was wrong with me".

## 2016-02-27 NOTE — Discharge Planning (Signed)
Pt up for discharge. EDCM reviewed chart for possible CM needs.  No needs identified or communicated.  

## 2016-02-27 NOTE — Discharge Instructions (Signed)
Please follow with your primary care doctor in the next 2 days for a check-up. They must obtain records for further management.  ° °Do not hesitate to return to the Emergency Department for any new, worsening or concerning symptoms.  ° °

## 2016-02-27 NOTE — ED Provider Notes (Signed)
[facility]-EMERGENCY DEPT Provider Note   CSN: [id number] Arrival date & time: 02/27/16  0307     History   Chief Complaint Chief Complaint  Patient presents with  . Chest Pain  . Shortness of Breath    HPI  Blood pressure 186/93, pulse 67, temperature 99 F (37.2 C), temperature source Oral, resp. rate 16, last menstrual period 01/26/2016, SpO2 100 %.  [name] [name] is a 46 y.o. 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 She doesn't have history of DVT/PE, no recent immobilizations, calf pain, leg swelling. She doesn't take any exogenous estrogen. No associated nausea, vomiting, change in bowel or bladder habits, cervicalgia, change in vision. She does note that her headache changed in the left side of her face is a pins and needles paresthesia several days ago. She denies any weakness, dysarthria, ataxia but on review of systems she also states that her left upper arm also has a pins and needles paresthesia. She is not a smoker and has never been a smoker and has no family history of ACS   Past Medical History:  Diagnosis Date  . Anxiety   . Carpal tunnel syndrome   . Depression   . Dysrhythmia    palpitations with anxiety attacks  . Gestational diabetes   . Gestational diabetes requiring insulin 02/20/2012  . Hemorrhoids   . High cholesterol   . Hypercholesterolemia    takes meds when not pregnant  . No pertinent past medical history   . Normal pregnancy, repeat  09/15/2010  . SVD (spontaneous vaginal delivery) 09/15/2010    Patient Active Problem List   Diagnosis Date Noted  . Depression 06/25/2015  . Generalized anxiety disorder 06/25/2015  . Neuralgia and neuritis 06/25/2015  . Gestational diabetes requiring insulin 02/20/2012    Past Surgical History:  Procedure Laterality Date  . NO PAST SURGERIES      OB History    Gravida Para Term Preterm AB Living   5 4 4  0 1 4   SAB TAB Ectopic Multiple Live Births   1 0 0 0 4       Home Medications    Prior to Admission medications   Medication Sig Start Date End Date Taking? Authorizing Provider  FLUoxetine (PROZAC) 40 MG capsule Take 40 mg by mouth daily.   Yes Historical Provider, MD  gemfibrozil (LOPID) 600 MG tablet Take 600 mg by mouth 2 (two) times daily before a meal.   Yes Historical Provider, MD  losartan (COZAAR) 50 MG tablet Take 50 mg by mouth daily. 03/22/15  Yes Historical Provider, MD  pantoprazole (PROTONIX) 20 MG tablet Take 1 tablet (20 mg total) by mouth daily. 12/24/14  Yes Arthor Captain, PA-C  topiramate (TOPAMAX) 25 MG tablet Take 1 tablet (25 mg total) by mouth at bedtime. Patient not taking: Reported on 02/27/2016 12/03/15   [name], NP    Family History Family History  Problem Relation Age of Onset  . Cancer Mother     brain  . Hypertension  Mother   . Hypertension Father   . Anxiety disorder Sister   . Depression Sister   . Hypertension Sister   . Diabetes Brother   . Migraines Neg Hx     Social History Social History  Substance Use Topics  . Smoking status: Never Smoker  . Smokeless tobacco: Never Used  . Alcohol use No     Allergies   Amoxicillin and Penicillins   Review of Systems Review of Systems  10 systems reviewed and found to be negative, except as noted in the HPI.   Physical Exam Updated Vital Signs BP 147/89 (BP Location: Right Arm)   Pulse 77   Temp 99 F (37.2 C) (Oral)   Resp 14   LMP 01/26/2016   SpO2 99%    Physical Exam  Constitutional: She is oriented to person, place, and time. She appears well-developed and well-nourished. No distress.  HENT:  Head: Normocephalic and atraumatic.  Mouth/Throat: Oropharynx is clear and moist.  Eyes: Conjunctivae and EOM are normal. Pupils are equal, round, and reactive to light.  No TTP of maxillary or frontal sinuses  Neck: Normal range of motion. Neck supple. No JVD present. No tracheal deviation present.  FROM to C-spine. Pt can touch chin to chest without discomfort. No TTP of midline cervical spine.   Cardiovascular: Normal rate, regular rhythm and intact distal pulses.   Radial pulse equal bilaterally  Pulmonary/Chest: Effort normal and breath sounds normal. No stridor. No respiratory distress. She has no wheezes. She has no rales. She exhibits no tenderness.  Abdominal: Soft. Bowel sounds are normal. She exhibits no distension and no mass. There is no tenderness. There is no rebound and no guarding.  Musculoskeletal: Normal range of motion. She exhibits no edema or tenderness.  No calf asymmetry, superficial collaterals, palpable cords, edema, Homans sign negative bilaterally.    Neurological: She is alert and oriented to person, place, and time. No cranial nerve deficit.  II-Visual fields grossly intact. III/IV/VI-Extraocular movements intact.  Pupils reactive bilaterally. V/VII-Smile symmetric, equal eyebrow raise,  facial sensation intact VIII- Hearing grossly intact IX/X-Normal gag XI-bilateral shoulder shrug XII-midline tongue extension Motor: 5/5 bilaterally with normal tone and bulk Cerebellar: Normal finger-to-nose  and normal heel-to-shin test.   Romberg negative Ambulates with a coordinated gait   Skin: Skin is warm. She is not diaphoretic.  Psychiatric: She has a normal mood and affect.  Nursing note and vitals reviewed.    ED Treatments / Results  Labs (all labs ordered are listed, but only abnormal results are  displayed) Labs Reviewed  BASIC METABOLIC PANEL - Abnormal; Notable for the following:       Result Value   Glucose, Bld 104 (*)    All other components within normal limits  CBC - Abnormal; Notable for the following:    Hemoglobin 10.7 (*)    HCT 32.3 (*)    All other components within normal limits  I-STAT TROPOININ, ED  I-STAT BETA HCG BLOOD, ED ([facility], [facility], [facility] ONLY)  I-STAT TROPOININ, ED    EKG  EKG Interpretation  Date/Time:  Thursday February 27 2016 06:21:27 EST Ventricular Rate:  60 PR Interval:    QRS Duration: 87 QT Interval:  456 QTC Calculation: 456 R Axis:   58 Text Interpretation:  Sinus rhythm Probable left atrial enlargement No significant change since last tracing Confirmed by WARD,  DO, KRISTEN ([id number]) on 02/27/2016 6:32:08 AM       Radiology Dg Chest 2 View  Result Date:  02/27/2016 CLINICAL DATA:  Left-sided chest pain onset non for year. EXAM: CHEST  2 VIEW COMPARISON:  06/13/2013 FINDINGS: The heart size and mediastinal contours are within normal limits. Both lungs are clear. The visualized skeletal structures are unremarkable. IMPRESSION: No active cardiopulmonary disease. Electronically Signed   By: Tollie Eth M.D.   On: 02/27/2016 03:45   Ct Head Wo Contrast  Result Date: 02/27/2016 CLINICAL DATA:  Headaches EXAM: CT HEAD WITHOUT CONTRAST TECHNIQUE: Contiguous axial images were obtained from the base of the skull through the vertex without intravenous contrast. COMPARISON:  05/15/2015 FINDINGS: Brain: No evidence of acute infarction, hemorrhage, hydrocephalus, extra-axial collection or mass lesion/mass effect. Basal ganglia calcifications are noted Vascular: No hyperdense vessel or unexpected calcification. Skull: Normal. Negative for fracture or focal lesion. Sinuses/Orbits: No acute finding. Other: None. IMPRESSION: No acute abnormality noted. Electronically Signed   By: Alcide Clever M.D.   On: 02/27/2016 07:36    Procedures Procedures (including critical  care time)  Medications Ordered in ED Medications  prochlorperazine (COMPAZINE) injection 10 mg (10 mg Intravenous Given 02/27/16 0720)  methylPREDNISolone sodium succinate (SOLU-MEDROL) 125 mg/2 mL injection 125 mg (125 mg Intravenous Given 02/27/16 0718)  diphenhydrAMINE (BENADRYL) injection 25 mg (25 mg Intravenous Given 02/27/16 0719)  sodium chloride 0.9 % bolus 500 mL (500 mLs Intravenous New Bag/Given 02/27/16 0717)  losartan (COZAAR) tablet 50 mg (50 mg Oral Given 02/27/16 3244)     Initial Impression / Assessment and Plan / ED Course  I have reviewed the triage vital signs and the nursing notes.  Pertinent labs & imaging results that were available during my care of the patient were reviewed by me and considered in my medical decision making (see chart for details).     Vitals:   02/27/16 0315 02/27/16 0645 02/27/16 0856 02/27/16 1020  BP: 186/93 154/95 153/84 147/89  Pulse: 67 60 69 77  Resp: 16 16 16 14   Temp: 99 F (37.2 C)     TempSrc: Oral     SpO2: 100% 99% 99% 99%    Medications  prochlorperazine (COMPAZINE) injection 10 mg (10 mg Intravenous Given 02/27/16 0720)  methylPREDNISolone sodium succinate (SOLU-MEDROL) 125 mg/2 mL injection 125 mg (125 mg Intravenous Given 02/27/16 0718)  diphenhydrAMINE (BENADRYL) injection 25 mg (25 mg Intravenous Given 02/27/16 0719)  sodium chloride 0.9 % bolus 500 mL (500 mLs Intravenous New Bag/Given 02/27/16 0717)  losartan (COZAAR) tablet 50 mg (50 mg Oral Given 02/27/16 0713)    [name] [name] is 46 y.o. female presenting with Headache for greater than one year and chest pain onset several days ago. EKG with no acute abnormality, blood work reassuring with negative troponin. We'll give headache cocktail. Her blood pressure is initially elevated but she hasn't had her blood pressure medications. Doubt PE. Patient is low risk by well's score and PERC negative.  CT head negative, headache improved however she is quite drowsy. Patient is  low risk by heart score. Will obtain delta troponin.  Delta troponin negative, chest pain has fully resolved, patient given referral to cardiology  Evaluation does not show pathology that would require ongoing emergent intervention or inpatient treatment. Pt is hemodynamically stable and mentating appropriately. Discussed findings and plan with patient/guardian, who agrees with care plan. All questions answered. Return precautions discussed and outpatient follow up given.    Final Clinical Impressions(s) / ED Diagnoses   Final diagnoses:  Atypical chest pain  Nonintractable headache, unspecified chronicity pattern, unspecified headache type  New Prescriptions New Prescriptions   No medications on file     Wynetta Emery, PA-C 02/27/16 [id number], PA-C 02/27/16 1031    Geoffery Lyons, MD 02/27/16 1436

## 2016-02-27 NOTE — ED Notes (Signed)
Pt ambulated to restroom, pt had no complaints of pain or nausea.

## 2016-03-03 ENCOUNTER — Encounter: Payer: Self-pay | Admitting: Adult Health

## 2016-03-03 ENCOUNTER — Ambulatory Visit (INDEPENDENT_AMBULATORY_CARE_PROVIDER_SITE_OTHER): Payer: BLUE CROSS/BLUE SHIELD | Admitting: Adult Health

## 2016-03-03 VITALS — BP 123/82 | HR 75 | Ht 63.0 in | Wt 123.0 lb

## 2016-03-03 DIAGNOSIS — G43119 Migraine with aura, intractable, without status migrainosus: Secondary | ICD-10-CM | POA: Diagnosis not present

## 2016-03-03 MED ORDER — TOPIRAMATE 25 MG PO TABS: ORAL_TABLET | ORAL | 11 refills | Status: DC

## 2016-03-03 NOTE — Patient Instructions (Addendum)
Take 1 tablet at night for 1 week then increase to 2 tablets at night If your symptoms worsen or you develop new symptoms please let us know.

## 2016-03-03 NOTE — Progress Notes (Signed)
PATIENT: [name] [name] DOB: 09/19/1970  REASON FOR VISIT: follow up- headache, neuralgia HISTORY FROM: patient  HISTORY OF PRESENT ILLNESS: [name] [name] is a 46 year old female with a history of headache and neuralgia. She returns today for follow-up. At the last visit the patient was started on Topamax. She reports that she was only able to start this medication 2 nights ago. She states that she did not have insurance in October and therefore did not start the medication. She states that she continues to have headaches. They normally on the left side of the head. She denies photophobia but confirms photophobia. Denies nausea and vomiting. She states that typically if she lays down and takes a nap or headache gets better when he returned the next day. She states she did see her primary care this week and gave her an injection. She reports that it helped only temporarily. The patient has never tried preventive medication. She typically will only take 1-2 doses and then stop the medication. She had a CT of the head  this month that was unremarkable. She returns today for an evaluation.    Update 12/03/15:  [name] [name] is a 46 year old female with a history of headache/neuralgia. She returns today for follow-up. She reports that she tried nortriptyline for 1 night however it made her too sleepy the next day. She states that she has to work and the medication made her so drowsy that she cannot drive. She reports that she has at least one headache a week. The headache normally occurs on the left side of the head. She describes it as a crawling sensation with sometimes shooting pain. She states that she normally can take Aleve and lay down and the headache will resolve. She states her headaches have been tolerable. She denies any new symptoms. Denies any numbness or tingling in the upper or lower extremities. She is also tried gabapentin in the past but was unable to tolerate this due to drowsiness. She  returns today for an evaluation.  HISTORY 06/25/15: [name] [name] a 46 y.o.femalehere as a referral from Dr. Doree FudgeBoydfor headache. PMHx depression, anxiety. She has a headache on the left side of the head. Also on the right side but not as much as the left. Started 2 months ago. She has the headache multiple times a week. She also has memory problems. The headache feels electric and vibrating like something crawling on her head like an insect. The headaches are a few times a week. It has gotten better on and off. She describes a lot of anxiety, headaches are worse when she is upset or anxious. Headache comes and goes. If she feels sad, she feels like something in her head. The pain has been mild in the last week. But it can be severe. She has pain on the right at one point (point to the right upper fronto-parietal junction) an dit hurts when she pushes down on that spot. The headache on the left radiates fronm above the eye to the left occipital rea with pain in the neck. She has sound sensitivity but not light sensitivity. Has some nausea. But no vomiting. No other focal neurologic deficits. Reviewed images of the brain with patient including benign cyst.  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  Ventricular size and CSF spaces normal. There is calcification in the right basal ganglia. No evidence for acute infarct, hemorrhage, or  mass lesion. No extra-axial fluid collections or midline shift. Calvarium intact. No fluid in the sinuses visualized.  IMPRESSION: No acute or significant findings.   CT of the head 03/1009(personally reviewed images and agree with the following): Comparison: None  Findings: Ventricular size and CSF spaces normal. There is calcification in the right basal ganglia. No evidence for acute infarct, hemorrhage, or mass lesion. No extra-axial fluid  collections or midline shift. Calvarium intact. No fluid in the sinuses visualized.  IMPRESSION: No acute or significant findings  REVIEW OF SYSTEMS: Out of a complete 14 system review of symptoms, the patient complains only of the following symptoms, and all other reviewed systems are negative.  See history of present illness  ALLERGIES: Allergies  Allergen Reactions  . Amoxicillin Rash  . Penicillins Rash    HOME MEDICATIONS: Outpatient Medications Prior to Visit  Medication Sig Dispense Refill  . FLUoxetine (PROZAC) 40 MG capsule Take 40 mg by mouth daily.    [name] Kitchen gemfibrozil (LOPID) 600 MG tablet Take 600 mg by mouth 2 (two) times daily before a meal.    . losartan (COZAAR) 50 MG tablet Take 50 mg by mouth daily.    . pantoprazole (PROTONIX) 20 MG tablet Take 1 tablet (20 mg total) by mouth daily. 30 tablet 0  . topiramate (TOPAMAX) 25 MG tablet Take 1 tablet (25 mg total) by mouth at bedtime. 30 tablet 11   No facility-administered medications prior to visit.     PAST MEDICAL HISTORY: Past Medical History:  Diagnosis Date  . Anxiety   . Carpal tunnel syndrome   . Depression   . Dysrhythmia    palpitations with anxiety attacks  . Gestational diabetes   . Gestational diabetes requiring insulin 02/20/2012  . Hemorrhoids   . High cholesterol   . Hypercholesterolemia    takes meds when not pregnant  . No pertinent past medical history   . Normal pregnancy, repeat 09/15/2010  . SVD (spontaneous vaginal delivery) 09/15/2010    PAST SURGICAL HISTORY: Past Surgical History:  Procedure Laterality Date  . NO PAST SURGERIES      FAMILY HISTORY: Family History  Problem Relation Age of Onset  . Cancer Mother     brain  . Hypertension Mother   . Hypertension Father   . Anxiety disorder Sister   . Depression Sister   . Hypertension Sister   . Diabetes Brother   . Migraines Neg Hx     SOCIAL HISTORY: Social History   Social History  . Marital status: Married      Spouse name: Einar Grad  . Number of children: 3  . Years of education: 12   Occupational History  . [employer]    Social History Main Topics  . Smoking status: Never Smoker  . Smokeless tobacco: Never Used  . Alcohol use No  . Drug use: No  . Sexual activity: Yes    Birth control/ protection: None   Other Topics Concern  . Not on file   Social History Narrative  . No narrative on file      PHYSICAL EXAM  Vitals:   03/03/16 0957  BP: 123/82  Pulse: 75  Weight: 123 lb (55.8 kg)  Height: 5\' 3"  (1.6 m)   Body mass index is 21.79 kg/m.  Generalized: Well developed, in no acute distress   Neurological examination  Mentation: Alert oriented to time, place, history taking. Follows all commands speech and language fluent Cranial nerve II-XII: Pupils were equal round  reactive to light. Extraocular movements were full, visual field were full on confrontational test. Facial sensation and strength were normal. Uvula tongue midline. Head turning and shoulder shrug  were normal and symmetric. Motor: The motor testing reveals 5 over 5 strength of all 4 extremities. Good symmetric motor tone is noted throughout.  Sensory: Sensory testing is intact to soft touch on all 4 extremities. No evidence of extinction is noted.  Coordination: Cerebellar testing reveals good finger-nose-finger and heel-to-shin bilaterally.  Gait and station: Gait is normal. Tandem gait is normal. Romberg is negative. No drift is seen.  Reflexes: Deep tendon reflexes are symmetric and normal bilaterally.   DIAGNOSTIC DATA (LABS, IMAGING, TESTING) - I reviewed patient records, labs, notes, testing and imaging myself where available.  Lab Results  Component Value Date   WBC 8.0 02/27/2016   HGB 10.7 (L) 02/27/2016   HCT 32.3 (L) 02/27/2016   MCV 81.4 02/27/2016   PLT 249 02/27/2016       ASSESSMENT AND PLAN 46 y.o. year old female  has a past medical history of Anxiety; Carpal tunnel  syndrome; Depression; Dysrhythmia; Gestational diabetes; Gestational diabetes requiring insulin (02/20/2012); Hemorrhoids; High cholesterol; Hypercholesterolemia; No pertinent past medical history; Normal pregnancy, repeat (09/15/2010); and SVD (spontaneous vaginal delivery) (09/15/2010). here with:  1. Migraine headaches  The patient has never taken daily medication. She was given Topamax in October however she never started this. She just recently started taking this and has  only taken 2 doses. I had a long conversation with the patient explaining that she needs to be on a preventative medication as she is having essentially daily headaches. Patient voiced understanding. She will continue taking Topamax 25 mg at bedtime for the next week after that she will increase to Topamax 50 mg at bedtime. She is to call if her headaches do not improve.  I spent 15 minutes with the patient 50% of this time was spent counseling the patient on preventative medication for migraines.     [name], MSN, NP-C 03/03/2016, 10:03 AM [facility] [address] [place], [place] [address] [phone]

## 2016-04-02 HISTORY — PX: HEMORRHOID BANDING: SHX5850

## 2016-04-12 NOTE — Progress Notes (Signed)
Personally  participated in, made any corrections needed, and agree with history, physical, neuro exam,assessment and plan as stated above.    Antonia Ahern, MD [facility] 

## 2016-09-01 ENCOUNTER — Ambulatory Visit (INDEPENDENT_AMBULATORY_CARE_PROVIDER_SITE_OTHER): Payer: BLUE CROSS/BLUE SHIELD | Admitting: Neurology

## 2016-09-01 ENCOUNTER — Encounter: Payer: Self-pay | Admitting: Neurology

## 2016-09-01 VITALS — BP 142/91 | HR 74 | Ht 60.0 in | Wt 121.8 lb

## 2016-09-01 DIAGNOSIS — G43009 Migraine without aura, not intractable, without status migrainosus: Secondary | ICD-10-CM | POA: Diagnosis not present

## 2016-09-01 MED ORDER — TOPIRAMATE 50 MG PO TABS: 50.0000 mg | ORAL_TABLET | Freq: Every day | ORAL | 12 refills | Status: DC

## 2016-09-01 NOTE — Progress Notes (Signed)
[name]    Provider:  Dr [name] [name] Referring Provider: Verlon Au, MD Primary Care Physician:  [name] Au, MD  CC:  Migraines  Interval history 09/01/2016: Patient is here for follow-up of headaches, likely migraine. She has not tolerated multiple medications in the last follow-up she was tried on topiramate. She has a past medical history of anxiety, depression, dysrhythmia, gestational diabetes, high cholesterol. Headaches were described to the left side of the head and electric and vibrating feeling like something is crawling in her head like an insect several times a week. This is in the setting of a lot of anxiety and headaches are worse when upset or anxious. The headache in the left radiates from above the eye to the left occipital area pain in the neck, she has sound sensitivity but not light sensitivity, has some nausea. She feels the Topiramate helps. The light bothers her. She just went up to 50mg .  When she cries or she is stressed out the left side of her head looks pale. No medication overuse. She has 1-2 headaches a month. They can last 1-2 days. Alleve/advil helps. 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 She has some tingling in the toes.   meds tried: gabapentin(did not tolerate), Nortriptyline(tried 1 pill made her too tired),   Update 12/03/15: [name] Penny, NP [name] [name] is a 46 year old female with a history of headache/neuralgia. She returns today for follow-up. She reports that she tried nortriptyline for 1 night however it made her too sleepy the next day. She states that she has to work and the medication made her so drowsy that she cannot drive. She reports that she has at least one headache a week. The headache normally occurs on the left side of the head. She describes it as a crawling sensation with sometimes shooting pain. She states that she normally can take Aleve and lay down and the headache will resolve. She  states her headaches have been tolerable. She denies any new symptoms. Denies any numbness or tingling in the upper or lower extremities. She is also tried gabapentin in the past but was unable to tolerate this due to drowsiness. She returns today for an evaluation.  HPI  06/2015 [name] [name]:  [name] [name] is a 46 y.o. female here as a referral from [name] [name] for headache. PMHx depression, anxiety. She has a headache on the left side of the head. Also on the right side but not as much as the left. Started 2 months ago. She has the headache multiple times a week. She also has memory problems. The headache feels electric and vibrating like something crawling on her head like an insect. The headaches are a few times a week. It has gotten better on and off. She describes a lot of anxiety, headaches are worse when she is upset or anxious. Headache comes and goes. If she feels sad, she feels like something in her head. The pain has been mild in the last week. But it can be severe. She has pain on the right at one point (point to the right upper fronto-parietal junction) an dit hurts when she pushes down on that spot. The headache on the left radiates fronm above the eye to the left occipital rea with pain in the neck. She has sound sensitivity but not light sensitivity. Has some nausea. But no vomiting. No other focal neurologic deficits. Reviewed images of the brain with patient including benign cyst.  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  Ventricular size and CSF spaces normal. There is calcification in the right basal ganglia. No evidence for acute infarct, hemorrhage, or mass lesion. No extra-axial fluid collections or midline shift. Calvarium intact. No fluid in the sinuses visualized.  IMPRESSION: No acute or significant findings.   CT of the head 03/1009(personally reviewed images and  agree with the following): Comparison: None  Findings: Ventricular size and CSF spaces normal. There is calcification in the right basal ganglia. No evidence for acute infarct, hemorrhage, or mass lesion. No extra-axial fluid collections or midline shift. Calvarium intact. No fluid in the sinuses visualized.  IMPRESSION: No acute or significant findings.  Social History   Social History  . Marital status: Married    Spouse name: [name] [name]  . Number of children: 3  . Years of education: 12   Occupational History  . [employer]    Social History Main Topics  . Smoking status: Never Smoker  . Smokeless tobacco: Never Used  . Alcohol use No  . Drug use: No  . Sexual activity: Yes    Birth control/ protection: None   Other Topics Concern  . Not on file   Social History Narrative  . No narrative on file    Family History  Problem Relation Age of Onset  . Cancer Mother        brain  . Hypertension Mother   . Hypertension Father   . Anxiety disorder Sister   . Depression Sister   . Hypertension Sister   . Diabetes Brother   . Migraines Neg Hx     Past Medical History:  Diagnosis Date  . Anxiety   . Carpal tunnel syndrome   . Depression   . Dysrhythmia    palpitations with anxiety attacks  . Gestational diabetes   . Gestational diabetes requiring insulin 02/20/2012  . Hemorrhoids   . High cholesterol   . Hypercholesterolemia    takes meds when not pregnant  . No pertinent past medical history   . Normal pregnancy, repeat 09/15/2010  . SVD (spontaneous vaginal delivery) 09/15/2010    Past Surgical History:  Procedure Laterality Date  . NO PAST SURGERIES      Current Outpatient Prescriptions  Medication Sig Dispense Refill  . FLUoxetine (PROZAC) 40 MG capsule Take 40 mg by mouth daily.    [name] Kitchen. gemfibrozil (LOPID) 600 MG tablet Take 600 mg by mouth 2 (two) times daily before a meal.    . losartan (COZAAR) 50 MG tablet Take 50 mg by mouth daily.      . pantoprazole (PROTONIX) 20 MG tablet Take 1 tablet (20 mg total) by mouth daily. 30 tablet 0  . topiramate (TOPAMAX) 25 MG tablet Take 1 tablet at bedtime for 1 week the increase to 2 tablets at bedtime 60 tablet 11   No current facility-administered medications for this visit.     Allergies as of 09/01/2016 - Review Complete 03/03/2016  Allergen Reaction Noted  . Amoxicillin Rash 08/21/2010  . Penicillins Rash 09/15/2010    Vitals: There were no vitals taken for this visit. Last Weight:  Wt Readings from Last 1 Encounters:  03/03/16 123 lb (55.8 kg)   Last Height:   Ht Readings from Last 1 Encounters:  03/03/16 5\' 3"  (1.6 m)   Physical exam: Exam: Gen: NAD, conversant, well nourised, obese, well groomed  CV: RRR, no MRG. No Carotid Bruits. No peripheral edema, warm, nontender Eyes: Conjunctivae clear without exudates or hemorrhage  Neuro: Detailed Neurologic Exam  Speech:    Speech is normal; fluent and spontaneous with normal comprehension.  Cognition:    The patient is oriented to person, place, and time;     recent and remote memory intact;     language fluent;     normal attention, concentration,     fund of knowledge Cranial Nerves:    The pupils are equal, round, and reactive to light. The fundi are normal and spontaneous venous pulsations are present. Visual fields are full to finger confrontation. Extraocular movements are intact. Trigeminal sensation is intact and the muscles of mastication are normal. The face is symmetric. The palate elevates in the midline. Hearing intact. Voice is normal. Shoulder shrug is normal. The tongue has normal motion without fasciculations.   Coordination:    Normal finger to nose and heel to shin. Normal rapid alternating movements.   Gait:    Heel-toe and tandem gait are normal.   Motor Observation:    No asymmetry, no atrophy, and no involuntary movements noted. Tone:    Normal muscle tone.     Posture:    Posture is normal. normal erect    Strength:    Strength is V/V in the upper and lower limbs.      Sensation: intact to LT     Reflex Exam:  DTR's:    Deep tendon reflexes in the upper and lower extremities are normal bilaterally.   Toes:    The toes are downgoing bilaterally.   Clonus:    Clonus is absent.       Assessment/Plan:  Assessment/Plan:  46 year old with migraines  - Increase Topiramate - Educated patient on migraines and provided reading material - At follow up can discuss trying triptan as acute management - Discussed: To prevent or relieve headaches, try the following: Cool Compress. Lie down and place a cool compress on your head.  Avoid headache triggers. If certain foods or odors seem to have triggered your migraines in the past, avoid them. A headache diary might help you identify triggers.  Include physical activity in your daily routine. Try a daily walk or other moderate aerobic exercise.  Manage stress. Find healthy ways to cope with the stressors, such as delegating tasks on your to-do list.  Practice relaxation techniques. Try deep breathing, yoga, massage and visualization.  Eat regularly. Eating regularly scheduled meals and maintaining a healthy diet might help prevent headaches. Also, drink plenty of fluids.  Follow a regular sleep schedule. Sleep deprivation might contribute to headaches Consider biofeedback. With this mind-body technique, you learn to control certain bodily functions - such as muscle tension, heart rate and blood pressure - to prevent headaches or reduce headache pain.    Proceed to emergency room if you experience new or worsening symptoms or symptoms do not resolve, if you have new neurologic symptoms or if headache is severe, or for any concerning symptom.   Provided education and documentation from American headache Society toolbox including articles on: chronic migraine medication overuse headache, chronic  migraines, prevention of migraines, behavioral and other nonpharmacologic treatments for headache.      Naomie DeanAntonia Davelyn Gwinn, MD  [facility] [address] [place], [place] [id number]  Phone [phone] Fax [phone]  A total of 25 minutes was spent face-to-face with this patient. Over half this time was spent on counseling patient  on the migraine diagnosis and different diagnostic and therapeutic options available.

## 2016-09-01 NOTE — Patient Instructions (Signed)
Migraine Headache A migraine headache is an intense, throbbing pain on one side or both sides of the head. Migraines may also cause other symptoms, such as nausea, vomiting, and sensitivity to light and noise. What are the causes? Doing or taking certain things may also trigger migraines, such as:  Alcohol.  Smoking.  Medicines, such as: ? Medicine used to treat chest pain (nitroglycerine). ? Birth control pills. ? Estrogen pills. ? Certain blood pressure medicines.  Aged cheeses, chocolate, or caffeine.  Foods or drinks that contain nitrates, glutamate, aspartame, or tyramine.  Physical activity.  Other things that may trigger a migraine include:  Menstruation.  Pregnancy.  Hunger.  Stress, lack of sleep, too much sleep, or fatigue.  Weather changes.  What increases the risk? The following factors may make you more likely to experience migraine headaches:  Age. Risk increases with age.  Family history of migraine headaches.  Being Caucasian.  Depression and anxiety.  Obesity.  Being a woman.  Having a hole in the heart (patent foramen ovale) or other heart problems.  What are the signs or symptoms? The main symptom of this condition is pulsating or throbbing pain. Pain may:  Happen in any area of the head, such as on one side or both sides.  Interfere with daily activities.  Get worse with physical activity.  Get worse with exposure to bright lights or loud noises.  Other symptoms may include:  Nausea.  Vomiting.  Dizziness.  General sensitivity to bright lights, loud noises, or smells.  Before you get a migraine, you may get warning signs that a migraine is developing (aura). An aura may include:  Seeing flashing lights or having blind spots.  Seeing bright spots, halos, or zigzag lines.  Having tunnel vision or blurred vision.  Having numbness or a tingling feeling.  Having trouble talking.  Having muscle weakness.  How is this  diagnosed? A migraine headache can be diagnosed based on:  Your symptoms.  A physical exam.  Tests, such as CT scan or MRI of the head. These imaging tests can help rule out other causes of headaches.  Taking fluid from the spine (lumbar puncture) and analyzing it (cerebrospinal fluid analysis, or CSF analysis).  How is this treated? A migraine headache is usually treated with medicines that:  Relieve pain.  Relieve nausea.  Prevent migraines from coming back.  Treatment may also include:  Acupuncture.  Lifestyle changes like avoiding foods that trigger migraines.  Follow these instructions at home: Medicines  Take over-the-counter and prescription medicines only as told by your health care provider.  Do not drive or use heavy machinery while taking prescription pain medicine.  To prevent or treat constipation while you are taking prescription pain medicine, your health care provider may recommend that you: ? Drink enough fluid to keep your urine clear or pale yellow. ? Take over-the-counter or prescription medicines. ? Eat foods that are high in fiber, such as fresh fruits and vegetables, whole grains, and beans. ? Limit foods that are high in fat and processed sugars, such as fried and sweet foods. Lifestyle  Avoid alcohol use.  Do not use any products that contain nicotine or tobacco, such as cigarettes and e-cigarettes. If you need help quitting, ask your health care provider.  Get at least 8 hours of sleep every night.  Limit your stress. General instructions   Keep a journal to find out what may trigger your migraine headaches. For example, write down: ? What you eat and   drink. ? How much sleep you get. ? Any change to your diet or medicines.  If you have a migraine: ? Avoid things that make your symptoms worse, such as bright lights. ? It may help to lie down in a dark, quiet room. ? Do not drive or use heavy machinery. ? Ask your health care provider  what activities are safe for you while you are experiencing symptoms.  Keep all follow-up visits as told by your health care provider. This is important. Contact a health care provider if:  You develop symptoms that are different or more severe than your usual migraine symptoms. Get help right away if:  Your migraine becomes severe.  You have a fever.  You have a stiff neck.  You have vision loss.  Your muscles feel weak or like you cannot control them.  You start to lose your balance often.  You develop trouble walking.  You faint. This information is not intended to replace advice given to you by your health care provider. Make sure you discuss any questions you have with your health care provider. Document Released: 01/19/2005 Document Revised: 08/09/2015 Document Reviewed: 07/08/2015 Elsevier Interactive Patient Education  2017 [employer]   

## 2016-09-22 ENCOUNTER — Telehealth: Payer: Self-pay | Admitting: Adult Health

## 2016-09-22 NOTE — Telephone Encounter (Signed)
Discussed patient with Megan.  She would like her to first try an OTC NSAID to see if it will relieve her pain.  I educated the patient in the consequences of rebound headaches with overuse of NSAIDS and she verbalized understanding.  If the pain persist, then she was instructed to call back to schedule an appointment.  A triptan may be added to her treatment regimen but she will need to discuss the potential side effects with a provider.

## 2016-09-22 NOTE — Telephone Encounter (Signed)
Patient says for the past week her head is hurting on the back of left side. She says it hurts to touch her head. She takes topiramate (TOPAMAX) 50 MG tablet but it has not helped.

## 2016-09-22 NOTE — Telephone Encounter (Signed)
Returned call to patient - reports persistent headache for the last week.  She has not taken any medication for her pain, including OTC NSAIDS.  She is using topiramate 50mg , qhs.  At her last office visit, there was mention of adding a triptan to her treatment regimen.  Her allergies have been verified and are correct in her chart.

## 2016-10-23 HISTORY — PX: CARPAL TUNNEL RELEASE: SHX101

## 2017-03-02 ENCOUNTER — Encounter: Payer: Self-pay | Admitting: Neurology

## 2017-03-02 ENCOUNTER — Ambulatory Visit: Payer: BLUE CROSS/BLUE SHIELD | Admitting: Adult Health

## 2017-03-02 ENCOUNTER — Ambulatory Visit: Payer: BLUE CROSS/BLUE SHIELD | Admitting: Neurology

## 2017-03-02 VITALS — BP 136/90 | HR 73 | Ht 60.0 in | Wt 125.0 lb

## 2017-03-02 DIAGNOSIS — G43009 Migraine without aura, not intractable, without status migrainosus: Secondary | ICD-10-CM | POA: Diagnosis not present

## 2017-03-02 MED ORDER — RIZATRIPTAN BENZOATE 10 MG PO TABS: 10.0000 mg | ORAL_TABLET | ORAL | 11 refills | Status: DC | PRN

## 2017-03-02 NOTE — Progress Notes (Signed)
[facility]    Provider:  Dr [name] Referring Provider: Verlon Au, MD Primary Care Physician:  Verlon Au, MD  CC: Migraines  Interval history: She stopped her topiramate on her own without calling us. Will give her a triptan for acute use. She is intolerant of many medications. No migraines this month. Excedrin helps. We discussed acute management.   Interval history 03/02/2017: Patient here for follow up of headaches likely migraine. She has not tolerates multiple medications well.  She has a past medical history of anxiety, depression, dysrhythmia, gestational diabetes, high cholesterol. Headaches were described to the left side of the head and electric and vibrating feeling like something is crawling in her head like an insect several times a week. This is in the setting of a lot of anxiety and headaches are worse when upset or anxious. The headache in the left radiates from above the eye to the left occipital area pain in the neck, she has sound sensitivity but not light sensitivity, has some nausea.  meds tried: gabapentin(did not tolerate), Nortriptyline(tried 1 pill made her too tired),   Interval history 09/01/2016: Patient is here for follow-up of headaches, likely migraine. She has not tolerated multiple medications in the last follow-up she was tried on topiramate. She has a past medical history of anxiety, depression, dysrhythmia, gestational diabetes, high cholesterol. Headaches were described to the left side of the head and electric and vibrating feeling like something is crawling in her head like an insect several times a week. This is in the setting of a lot of anxiety and headaches are worse when upset or anxious. The headache in the left radiates from above the eye to the left occipital area pain in the neck, she has sound sensitivity but not light sensitivity, has some nausea. She feels the Topiramate helps. The light bothers her. She just  went up to 50mg .  When she cries or she is stressed out the left side of her head looks pale. No medication overuse. She has 1-2 headaches a month. They can last 1-2 days. Alleve/advil helps. 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 She has some tingling in the toes.   meds tried: gabapentin(did not tolerate), Nortriptyline(tried 1 pill made her too tired),   Update 12/03/15: [name], NP Ms. Yousuf is a 47 year old female with a history of headache/neuralgia. She returns today for follow-up. She reports that she tried nortriptyline for 1 night however it made her too sleepy the next day. She states that she has to work and the medication made her so drowsy that she cannot drive. She reports that she has at least one headache a week. The headache normally occurs on the left side of the head. She describes it as a crawling sensation withsometimes shooting pain. She states that she normally can take Aleve and lay down and the headache will resolve. She states her headaches have been tolerable. She denies any new symptoms. Denies any numbness or tingling in the upper or lower extremities. She is also tried gabapentin in the past but was unable to tolerate this due to drowsiness. She returns today for an evaluation.  HPI  06/2015 Ahern:Rahima H Nguyenis a 47 y.o.femalehere as a referral from Dr. Doree Fudge headache. PMHx depression, anxiety. She has a headache on the left side of the head. Also on the right side but not as much as the left. Started 2 months ago. She has the headache multiple times a week.  She also has memory problems. The headache feels electric and vibrating like something crawling on her head like an insect. The headaches are a few times a week. It has gotten better on and off. She describes a lot of anxiety, headaches are worse when she is upset or anxious. Headache comes and goes. If she feels sad, she feels like something in her head. The pain has been mild in the  last week. But it can be severe. She has pain on the right at one point (point to the right upper fronto-parietal junction) an dit hurts when she pushes down on that spot. The headache on the left radiates fronm above the eye to the left occipital rea with pain in the neck. She has sound sensitivity but not light sensitivity. Has some nausea. But no vomiting. No other focal neurologic deficits. Reviewed images of the brain with patient including benign cyst.  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  Ventricular size and CSF spaces normal. There is calcification in the right basal ganglia. No evidence for acute infarct, hemorrhage, or mass lesion. No extra-axial fluid collections or midline shift. Calvarium intact. No fluid in the sinuses visualized.  IMPRESSION: No acute or significant findings.   CT of the head 03/1009(personally reviewed images and agree with the following): Comparison: None  Findings: Ventricular size and CSF spaces normal. There is calcification in the right basal ganglia. No evidence for acute infarct, hemorrhage, or mass lesion. No extra-axial fluid collections or midline shift. Calvarium intact. No fluid in the sinuses visualized.  IMPRESSION: No acute or significant findings.   Social History   Socioeconomic History  . Marital status: Married    Spouse name: Einar Grad  . Number of children: 3  . Years of education: 30  . Highest education level: Not on file  Social Needs  . Financial resource strain: Not on file  . Food insecurity - worry: Not on file  . Food insecurity - inability: Not on file  . Transportation needs - medical: Not on file  . Transportation needs - non-medical: Not on file  Occupational History  . Occupation: [employer]  Tobacco Use  . Smoking status: Never Smoker  . Smokeless tobacco: Never Used    Substance and Sexual Activity  . Alcohol use: No  . Drug use: No  . Sexual activity: Yes    Birth control/protection: None  Other Topics Concern  . Not on file  Social History Narrative   Lives at home w/ her husband and children   Right-handed   No caffeine    Family History  Problem Relation Age of Onset  . Cancer Mother        brain  . Hypertension Mother   . Hypertension Father   . Anxiety disorder Sister   . Depression Sister   . Hypertension Sister   . Diabetes Brother   . Migraines Neg Hx     Past Medical History:  Diagnosis Date  . Anxiety   . Carpal tunnel syndrome   . Depression   . Dysrhythmia    palpitations with anxiety attacks  . Gestational diabetes   . Gestational diabetes requiring insulin 02/20/2012  . Hemorrhoids   . High cholesterol   . Hypercholesterolemia    takes meds when not pregnant  . No pertinent past medical history   . Normal pregnancy, repeat 09/15/2010  . SVD (spontaneous  vaginal delivery) 09/15/2010    Past Surgical History:  Procedure Laterality Date  . CARPAL TUNNEL RELEASE Right 10/23/2016    Current Outpatient Medications  Medication Sig Dispense Refill  . atorvastatin (LIPITOR) 10 MG tablet Take 10 mg by mouth daily at 6 PM.     . FLUoxetine (PROZAC) 40 MG capsule Take 40 mg by mouth daily.    [name] Kitchen. losartan (COZAAR) 50 MG tablet Take 50 mg by mouth daily.    . metFORMIN (GLUCOPHAGE) 500 MG tablet Take 500 mg by mouth daily with breakfast.    . pantoprazole (PROTONIX) 20 MG tablet Take 1 tablet (20 mg total) by mouth daily. 30 tablet 0  . topiramate (TOPAMAX) 50 MG tablet Take 1 tablet (50 mg total) by mouth at bedtime. 30 tablet 12   No current facility-administered medications for this visit.     Allergies as of 03/02/2017 - Review Complete 03/02/2017  Allergen Reaction Noted  . Amoxicillin Rash 08/21/2010  . Doxycycline Nausea And Vomiting 07/02/2016  . Penicillins Rash 09/15/2010    Vitals: BP 136/90 (BP  Location: Left Arm, Patient Position: Sitting)   Pulse 73   Ht 5' (1.524 m)   Wt 125 lb (56.7 kg)   BMI 24.41 kg/m  Last Weight:  Wt Readings from Last 1 Encounters:  03/02/17 125 lb (56.7 kg)   Last Height:   Ht Readings from Last 1 Encounters:  03/02/17 5' (1.524 m)   Physical exam: Exam: Gen: NAD, conversant, well nourised, obese, well groomed                     CV: RRR, no MRG. No Carotid Bruits. No peripheral edema, warm, nontender Eyes: Conjunctivae clear without exudates or hemorrhage  Neuro: Detailed Neurologic Exam  Speech:    Speech is normal; fluent and spontaneous with normal comprehension.  Cognition:    The patient is oriented to person, place, and time;     recent and remote memory intact;     language fluent;     normal attention, concentration,     fund of knowledge Cranial Nerves:    The pupils are equal, round, and reactive to light. The fundi are normal and spontaneous venous pulsations are present. Visual fields are full to finger confrontation. Extraocular movements are intact. Trigeminal sensation is intact and the muscles of mastication are normal. The face is symmetric. The palate elevates in the midline. Hearing intact. Voice is normal. Shoulder shrug is normal. The tongue has normal motion without fasciculations.   Coordination:    Normal finger to nose and heel to shin. Normal rapid alternating movements.   Gait:    Heel-toe and tandem gait are normal.   Motor Observation:    No asymmetry, no atrophy, and no involuntary movements noted. Tone:    Normal muscle tone.    Posture:    Posture is normal. normal erect    Strength:    Strength is V/V in the upper and lower limbs.      Sensation: intact to LT     Reflex Exam:  DTR's:    Deep tendon reflexes in the upper and lower extremities are normal bilaterally.   Toes:    The toes are downgoing bilaterally.   Clonus:    Clonus is absent.     Assessment/Plan:  47 year old with  headaches, likely migraines. Discussed acute management. Can take excedrin, no more than 10x a month to avoid rebound. Also may take Maxalt. Will  not start preventative, she is improved.   meds tried: gabapentin(did not tolerate), Nortriptyline(tried 1 pill made her too tired), Topiramate (made her tired)  Rizatriptan: Please take one tablet at the onset of your headache. If it does not improve the symptoms please take one additional tablet. Do not take more then 2 tablets in 24hrs. Do not take use more then 2 to 3 times in a week.  Discussed: To prevent or relieve headaches, try the following: Cool Compress. Lie down and place a cool compress on your head.  Avoid headache triggers. If certain foods or odors seem to have triggered your migraines in the past, avoid them. A headache diary might help you identify triggers.  Include physical activity in your daily routine. Try a daily walk or other moderate aerobic exercise.  Manage stress. Find healthy ways to cope with the stressors, such as delegating tasks on your to-do list.  Practice relaxation techniques. Try deep breathing, yoga, massage and visualization.  Eat regularly. Eating regularly scheduled meals and maintaining a healthy diet might help prevent headaches. Also, drink plenty of fluids.  Follow a regular sleep schedule. Sleep deprivation might contribute to headaches Consider biofeedback. With this mind-body technique, you learn to control certain bodily functions - such as muscle tension, heart rate and blood pressure - to prevent headaches or reduce headache pain.    Proceed to emergency room if you experience new or worsening symptoms or symptoms do not resolve, if you have new neurologic symptoms or if headache is severe, or for any concerning symptom.   Provided education and documentation from American headache Society toolbox including articles on: chronic migraine medication overuse headache, chronic migraines, prevention of  migraines, behavioral and other nonpharmacologic treatments for headache.  Naomie Dean, MD  [facility] [address] [place], [place] [address]  Phone [phone] Fax [phone]  A total of 15 minutes was spent face-to-face with this patient. Over half this time was spent on counseling patient on the acute migraine diagnosis and different diagnostic and therapeutic options available.

## 2017-03-02 NOTE — Patient Instructions (Signed)
Rizatriptan: Please take one tablet at the onset of your headache. If it does not improve the symptoms please take one additional tablet in 2 hours. Do not take more then 2 tablets in 24hrs. Do not take use more then 2 to 3 times in a week.  Rizatriptan tablets What is this medicine? RIZATRIPTAN (rye za TRIP tan) is used to treat migraines with or without aura. An aura is a strange feeling or visual disturbance that warns you of an attack. It is not used to prevent migraines. This medicine may be used for other purposes; ask your health care provider or pharmacist if you have questions. COMMON BRAND NAME(S): Maxalt What should I tell my health care provider before I take this medicine? They need to know if you have any of these conditions: -bowel disease or colitis -diabetes -family history of heart disease -fast or irregular heart beat -heart or blood vessel disease, angina (chest pain), or previous heart attack -high blood pressure -high cholesterol -history of stroke, transient ischemic attacks (TIAs or mini-strokes), or intracranial bleeding -kidney or liver disease -overweight -poor circulation -postmenopausal or surgical removal of uterus and ovaries -Raynaud's disease -seizure disorder -an unusual or allergic reaction to rizatriptan, other medicines, foods, dyes, or preservatives -pregnant or trying to get pregnant -breast-feeding How should I use this medicine? This medicine is taken by mouth with a glass of water. Follow the directions on the prescription label. This medicine is taken at the first symptoms of a migraine. It is not for everyday use. If your migraine headache returns after one dose, you can take another dose as directed. You must leave at least 2 hours between doses, and do not take more than 30 mg total in 24 hours. If there is no improvement at all after the first dose, do not take a second dose without talking to your doctor or health care professional. Do not  take your medicine more often than directed. Talk to your pediatrician regarding the use of this medicine in children. While this drug may be prescribed for children as young as 6 years for selected conditions, precautions do apply. Overdosage: If you think you have taken too much of this medicine contact a poison control center or emergency room at once. NOTE: This medicine is only for you. Do not share this medicine with others. What if I miss a dose? This does not apply; this medicine is not for regular use. What may interact with this medicine? Do not take this medicine with any of the following medicines: -amphetamine, dextroamphetamine or cocaine -dihydroergotamine, ergotamine, ergoloid mesylates, methysergide, or ergot-type medication - do not take within 24 hours of taking rizatriptan -feverfew -MAOIs like Carbex, Eldepryl, Marplan, Nardil, and Parnate - do not take rizatriptan within 2 weeks of stopping MAOI therapy. -other migraine medicines like almotriptan, eletriptan, naratriptan, sumatriptan, zolmitriptan - do not take within 24 hours of taking rizatriptan -tryptophan This medicine may also interact with the following medications: -medicines for mental depression, anxiety or mood problems -propranolol This list may not describe all possible interactions. Give your health care provider a list of all the medicines, herbs, non-prescription drugs, or dietary supplements you use. Also tell them if you smoke, drink alcohol, or use illegal drugs. Some items may interact with your medicine. What should I watch for while using this medicine? Only take this medicine for a migraine headache. Take it if you get warning symptoms or at the start of a migraine attack. It is not for regular use  to prevent migraine attacks. You may get drowsy or dizzy. Do not drive, use machinery, or do anything that needs mental alertness until you know how this medicine affects you. To reduce dizzy or fainting  spells, do not sit or stand up quickly, especially if you are an older patient. Alcohol can increase drowsiness, dizziness and flushing. Avoid alcoholic drinks. Smoking cigarettes may increase the risk of heart-related side effects from using this medicine. If you take migraine medicines for 10 or more days a month, your migraines may get worse. Keep a diary of headache days and medicine use. Contact your healthcare professional if your migraine attacks occur more frequently. What side effects may I notice from receiving this medicine? Side effects that you should report to your doctor or health care professional as soon as possible: -allergic reactions like skin rash, itching or hives, swelling of the face, lips, or tongue -fast, slow, or irregular heart beat -increased or decreased blood pressure -seizures -severe stomach pain and cramping, bloody diarrhea -signs and symptoms of a blood clot such as breathing problems; changes in vision; chest pain; severe, sudden headache; pain, swelling, warmth in the leg; trouble speaking; sudden numbness or weakness of the face, arm or leg -tingling, pain, or numbness in the face, hands, or feet Side effects that usually do not require medical attention (report to your doctor or health care professional if they continue or are bothersome): -drowsiness -dry mouth -feeling warm, flushing, or redness of the face -headache -muscle cramps, pain -nausea, vomiting -unusually weak or tired This list may not describe all possible side effects. Call your doctor for medical advice about side effects. You may report side effects to FDA at 1-800-FDA-1088. Where should I keep my medicine? Keep out of the reach of children. Store at room temperature between 15 and 30 degrees C (59 and 86 degrees F). Keep container tightly closed. Throw away any unused medicine after the expiration date. NOTE: This sheet is a summary. It may not cover all possible information. If you  have questions about this medicine, talk to your doctor, pharmacist, or health care provider.  2018 Elsevier/Gold Standard (2012-09-20 10:16:39)

## 2017-07-09 ENCOUNTER — Emergency Department (HOSPITAL_COMMUNITY): Payer: BLUE CROSS/BLUE SHIELD

## 2017-07-09 ENCOUNTER — Encounter (HOSPITAL_COMMUNITY): Payer: Self-pay | Admitting: *Deleted

## 2017-07-09 ENCOUNTER — Other Ambulatory Visit: Payer: Self-pay

## 2017-07-09 ENCOUNTER — Emergency Department (HOSPITAL_COMMUNITY)
Admission: EM | Admit: 2017-07-09 | Discharge: 2017-07-09 | Disposition: A | Discharge status: Home / Self Care | Payer: BLUE CROSS/BLUE SHIELD | Attending: Physician Assistant | Admitting: Physician Assistant

## 2017-07-09 DIAGNOSIS — Y998 Other external cause status: Secondary | ICD-10-CM | POA: Insufficient documentation

## 2017-07-09 DIAGNOSIS — R109 Unspecified abdominal pain: Secondary | ICD-10-CM | POA: Insufficient documentation

## 2017-07-09 DIAGNOSIS — Y929 Unspecified place or not applicable: Secondary | ICD-10-CM | POA: Diagnosis not present

## 2017-07-09 DIAGNOSIS — R110 Nausea: Secondary | ICD-10-CM | POA: Insufficient documentation

## 2017-07-09 DIAGNOSIS — M7918 Myalgia, other site: Secondary | ICD-10-CM | POA: Insufficient documentation

## 2017-07-09 DIAGNOSIS — Z7984 Long term (current) use of oral hypoglycemic drugs: Secondary | ICD-10-CM | POA: Insufficient documentation

## 2017-07-09 DIAGNOSIS — Z79899 Other long term (current) drug therapy: Secondary | ICD-10-CM | POA: Insufficient documentation

## 2017-07-09 DIAGNOSIS — S39012A Strain of muscle, fascia and tendon of lower back, initial encounter: Secondary | ICD-10-CM | POA: Insufficient documentation

## 2017-07-09 DIAGNOSIS — S3992XA Unspecified injury of lower back, initial encounter: Secondary | ICD-10-CM | POA: Diagnosis present

## 2017-07-09 DIAGNOSIS — R531 Weakness: Secondary | ICD-10-CM | POA: Insufficient documentation

## 2017-07-09 DIAGNOSIS — X58XXXA Exposure to other specified factors, initial encounter: Secondary | ICD-10-CM | POA: Diagnosis not present

## 2017-07-09 DIAGNOSIS — Y939 Activity, unspecified: Secondary | ICD-10-CM | POA: Insufficient documentation

## 2017-07-09 LAB — URINALYSIS, ROUTINE W REFLEX MICROSCOPIC
Bilirubin Urine: NEGATIVE
Glucose, UA: NEGATIVE mg/dL
Hgb urine dipstick: NEGATIVE
Ketones, ur: NEGATIVE mg/dL
LEUKOCYTES UA: NEGATIVE
NITRITE: NEGATIVE
PH: 7 (ref 5.0–8.0)
Protein, ur: NEGATIVE mg/dL
SPECIFIC GRAVITY, URINE: 1.01 (ref 1.005–1.030)

## 2017-07-09 LAB — I-STAT BETA HCG BLOOD, ED (MC, WL, AP ONLY)

## 2017-07-09 LAB — COMPREHENSIVE METABOLIC PANEL
ALBUMIN: 4.1 g/dL (ref 3.5–5.0)
ALT: 20 U/L (ref 14–54)
AST: 19 U/L (ref 15–41)
Alkaline Phosphatase: 67 U/L (ref 38–126)
Anion gap: 10 (ref 5–15)
BILIRUBIN TOTAL: 0.8 mg/dL (ref 0.3–1.2)
BUN: 8 mg/dL (ref 6–20)
CALCIUM: 9 mg/dL (ref 8.9–10.3)
CO2: 26 mmol/L (ref 22–32)
Chloride: 103 mmol/L (ref 101–111)
Creatinine, Ser: 0.57 mg/dL (ref 0.44–1.00)
Glucose, Bld: 110 mg/dL — ABNORMAL HIGH (ref 65–99)
POTASSIUM: 3.7 mmol/L (ref 3.5–5.1)
Sodium: 139 mmol/L (ref 135–145)
TOTAL PROTEIN: 7.6 g/dL (ref 6.5–8.1)

## 2017-07-09 LAB — CBC
HEMATOCRIT: 39 % (ref 36.0–46.0)
HEMOGLOBIN: 12.2 g/dL (ref 12.0–15.0)
MCH: 26.6 pg (ref 26.0–34.0)
MCHC: 31.3 g/dL (ref 30.0–36.0)
MCV: 85.2 fL (ref 78.0–100.0)
Platelets: 162 10*3/uL (ref 150–400)
RBC: 4.58 MIL/uL (ref 3.87–5.11)
RDW: 13.2 % (ref 11.5–15.5)
WBC: 8.2 10*3/uL (ref 4.0–10.5)

## 2017-07-09 LAB — LIPASE, BLOOD: Lipase: 37 U/L (ref 11–51)

## 2017-07-09 MED ORDER — CYCLOBENZAPRINE HCL 10 MG PO TABS: 10.0000 mg | ORAL_TABLET | Freq: Two times a day (BID) | ORAL | 0 refills | Status: DC | PRN

## 2017-07-09 MED ORDER — ACETAMINOPHEN 325 MG PO TABS
650.0000 mg | ORAL_TABLET | Freq: Once | ORAL | Status: DC
  Filled 2017-07-09: qty 2

## 2017-07-09 MED ORDER — NAPROXEN 500 MG PO TABS: 500.0000 mg | ORAL_TABLET | Freq: Two times a day (BID) | ORAL | 0 refills | Status: DC

## 2017-07-09 NOTE — ED Notes (Signed)
Patient transported to CT 

## 2017-07-09 NOTE — ED Triage Notes (Signed)
Pt in c/o left flank pain that started last week, over the last few days states she has chills and body aches, increased weakness and states she is just too tired to function like normal, no distress noted in triage, pt is diabetic and CBG this morning was 98 and she normally runs around 100 in the am. Pt reports n/v x1 last night.

## 2017-07-09 NOTE — ED Provider Notes (Signed)
[facility] EMERGENCY DEPARTMENT Provider Note   CSN: [id number] Arrival date & time: 07/09/17  0906     History   Chief Complaint Chief Complaint  Patient presents with  . Flank Pain  . Weakness    HPI [name] [name] is a 47 y.o. female with a past medical history of diabetes, hemorrhoids, who presents to ED for evaluation of 1 week history of left-sided flank pain and back pain.  She also reports associated generalized body aches and weakness.  She has not taken any medications to help with her symptoms.  She reports associated nausea and did have one episode of nonbloody, nonbilious emesis several days ago.  Also reports one episode of diarrhea last week.  Denies any fevers, hematuria, dysuria, shortness of breath, chest pain, sick contacts with similar symptoms, prior abdominal surgeries, hematochezia or melena.  HPI  Past Medical History:  Diagnosis Date  . Anxiety   . Carpal tunnel syndrome   . Depression   . Dysrhythmia    palpitations with anxiety attacks  . Gestational diabetes   . Gestational diabetes requiring insulin 02/20/2012  . Hemorrhoids   . High cholesterol   . Hypercholesterolemia    takes meds when not pregnant  . No pertinent past medical history   . Normal pregnancy, repeat 09/15/2010  . SVD (spontaneous vaginal delivery) 09/15/2010    Patient Active Problem List   Diagnosis Date Noted  . Depression 06/25/2015  . Generalized anxiety disorder 06/25/2015  . Neuralgia and neuritis 06/25/2015  . Gestational diabetes requiring insulin 02/20/2012    Past Surgical History:  Procedure Laterality Date  . CARPAL TUNNEL RELEASE Right 10/23/2016     OB History    Gravida  5   Para  4   Term  4   Preterm  0   AB  1   Living  4     SAB  1   TAB  0   Ectopic  0   Multiple  0   Live Births  4            Home Medications    Prior to Admission medications   Medication Sig Start Date End Date Taking? Authorizing  Provider  atorvastatin (LIPITOR) 10 MG tablet Take 10 mg by mouth daily at 6 PM.  08/11/16 08/11/17 Yes [provider]  fexofenadine (ALLEGRA) 180 MG tablet Take 180 mg by mouth daily as needed for allergies or rhinitis.   Yes [provider]  FLUoxetine (PROZAC) 40 MG capsule Take 40 mg by mouth daily.   Yes [provider]  losartan (COZAAR) 50 MG tablet Take 50 mg by mouth daily. 03/22/15  Yes [provider]  metFORMIN (GLUCOPHAGE) 500 MG tablet Take 500 mg by mouth daily with breakfast.   Yes [provider]  pantoprazole (PROTONIX) 20 MG tablet Take 1 tablet (20 mg total) by mouth daily. 12/24/14  Yes Harris, Abigail, PA-C  rizatriptan (MAXALT) 10 MG tablet Take 1 tablet (10 mg total) by mouth as needed for migraine. May repeat in 2 hours. Maximum twice in one day. 03/02/17  Yes Anson Fret, MD  cyclobenzaprine (FLEXERIL) 10 MG tablet Take 1 tablet (10 mg total) by mouth 2 (two) times daily as needed for muscle spasms. 07/09/17   Ottavio Norem, PA-C  naproxen (NAPROSYN) 500 MG tablet Take 1 tablet (500 mg total) by mouth 2 (two) times daily. 07/09/17   Dietrich Pates, PA-C    Family History Family  History  Problem Relation Age of Onset  . Cancer Mother        brain  . Hypertension Mother   . Hypertension Father   . Anxiety disorder Sister   . Depression Sister   . Hypertension Sister   . Diabetes Brother   . Migraines Neg Hx     Social History Social History   Tobacco Use  . Smoking status: Never Smoker  . Smokeless tobacco: Never Used  Substance Use Topics  . Alcohol use: No  . Drug use: No     Allergies   Amoxicillin; Doxycycline; and Penicillins   Review of Systems Review of Systems  Constitutional: Negative for appetite change, chills and fever.  HENT: Negative for ear pain, rhinorrhea, sneezing and sore throat.   Eyes: Negative for photophobia and visual disturbance.  Respiratory: Negative for cough, chest tightness,  shortness of breath and wheezing.   Cardiovascular: Negative for chest pain and palpitations.  Gastrointestinal: Positive for diarrhea and vomiting. Negative for abdominal pain, blood in stool, constipation and nausea.  Genitourinary: Positive for flank pain. Negative for dysuria, hematuria and urgency.  Musculoskeletal: Negative for myalgias.  Skin: Negative for rash.  Neurological: Negative for dizziness, weakness and light-headedness.     Physical Exam Updated Vital Signs BP (!) 182/97   Pulse 60   Temp 98.7 F (37.1 C) (Oral)   Resp 16   LMP 06/23/2017   SpO2 98%   Physical Exam  Constitutional: She appears well-developed and well-nourished. No distress.  HENT:  Head: Normocephalic and atraumatic.  Nose: Nose normal.  Eyes: Conjunctivae and EOM are normal. Left eye exhibits no discharge. No scleral icterus.  Neck: Normal range of motion. Neck supple.  Cardiovascular: Normal rate, regular rhythm, normal heart sounds and intact distal pulses. Exam reveals no gallop and no friction rub.  No murmur heard. Pulmonary/Chest: Effort normal and breath sounds normal. No respiratory distress.  Abdominal: Soft. Bowel sounds are normal. She exhibits no distension. There is no tenderness. There is no guarding.  Musculoskeletal: Normal range of motion. She exhibits tenderness. She exhibits no edema.       Back:  No midline spinal tenderness present in lumbar, thoracic or cervical spine. No step-off palpated. No visible bruising, edema or temperature change noted. No objective signs of numbness present. No saddle anesthesia. 2+ DP pulses bilaterally. Sensation intact to light touch. Strength 5/5 in bilateral lower extremities.  Neurological: She is alert. She exhibits normal muscle tone. Coordination normal.  Skin: Skin is warm and dry. No rash noted.  Psychiatric: She has a normal mood and affect.  Nursing note and vitals reviewed.    ED Treatments / Results  Labs (all labs ordered  are listed, but only abnormal results are displayed) Labs Reviewed  COMPREHENSIVE METABOLIC PANEL - Abnormal; Notable for the following components:      Result Value   Glucose, Bld 110 (*)    All other components within normal limits  LIPASE, BLOOD  CBC  URINALYSIS, ROUTINE W REFLEX MICROSCOPIC  I-STAT BETA HCG BLOOD, ED ([facility], [facility], [facility] ONLY)    EKG None  Radiology Ct Renal Stone Study  Result Date: 07/09/2017 CLINICAL DATA:  Left flank pain EXAM: CT ABDOMEN AND PELVIS WITHOUT CONTRAST TECHNIQUE: Multidetector CT imaging of the abdomen and pelvis was performed following the standard protocol without oral or IV contrast. COMPARISON:  May 13, 2014 FINDINGS: Lower chest: There is slight bibasilar atelectasis. No lung base consolidation evident. Hepatobiliary: No focal liver lesions are  appreciable on this noncontrast enhanced study. Gallbladder wall is not appreciably thickened. There is no biliary duct dilatation. Pancreas: There is no pancreatic mass or inflammatory focus. Spleen: No splenic lesions are evident. Adrenals/Urinary Tract: Adrenals bilaterally appear normal. Kidneys bilaterally show no evident mass or hydronephrosis on either side. There is no appreciable renal or ureteral calculus on either side. Urinary bladder is midline with wall thickness within normal limits. Stomach/Bowel: There is moderate stool throughout the colon. There is no appreciable bowel wall or mesenteric thickening. No evident bowel obstruction. No free air or portal venous air. Vascular/Lymphatic: There is atherosclerotic calcification in the aorta and common iliac arteries. No aneurysm evident. Major mesenteric arterial vessels appear patent on this noncontrast enhanced study. There is no appreciable adenopathy in the abdomen or pelvis. Reproductive: Uterus is anteverted. Uterus appears rather prominent, stable from prior study. No extrauterine pelvic mass evident. Essure devices are noted in each fallopian tube.  Other: Appendix appears unremarkable. There is no abscess or ascites in the abdomen or pelvis. There is a minimal ventral hernia which contains a portion of a loop of bowel without bowel compromise. 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 No blastic or lytic bone lesions are evident. No intramuscular or abdominal wall lesions evident. IMPRESSION: 1.  No renal or ureteral calculi.  No hydronephrosis on either side. 2. No bowel wall thickening or bowel obstruction evident. No abscess. Appendix region appears normal. 3. Prominent uterus, potentially with underlying leiomyomatous change. No extrauterine pelvic mass. Essure occlusion device is noted in each fallopian tube. 4. Stable osteitis condensans ilia bilaterally. Clinical significance of this finding is questionable. 5. Minimal ventral hernia containing a portion of a loop of bowel without bowel compromise. 6.  Aortoiliac atherosclerosis. Aortic Atherosclerosis (ICD10-I70.0). Electronically Signed   By: Bretta BangWilliam  Woodruff III M.D.   On: 07/09/2017 14:07    Procedures Procedures (including critical care time)  Medications Ordered in ED Medications  acetaminophen (TYLENOL) tablet 650 mg (has no administration in time range)     Initial Impression / Assessment and Plan / ED Course  I have reviewed the triage vital signs and the nursing notes.  Pertinent labs & imaging results that were available during my care of the patient were reviewed by me and considered in my medical decision making (see chart for details).  Clinical Course as of Jul 09 1425  Fri Jul 09, 2017  1245 Patient refuses IV fluids or IV pain medications.   [HK]    Clinical Course User Index [HK] Dietrich PatesKhatri, Mannie Ohlin, PA-C    Patient presents to ED for evaluation of 1 week history of left-sided flank pain and back pain.  She reports associated generalized body aches and weakness.  Has not taken any medications to help with her symptoms.  One episode of  vomiting and one episode of diarrhea last week.  Denies any fevers, shortness of breath, dysuria, abdominal pain.  On physical exam she is overall well-appearing.  She does have some left-sided lower lumbar paraspinal muscular tenderness to palpation.  No deficits on neurological exam noted.  No abdominal tenderness to palpation.  She is afebrile.  Lab work including CBC, CMP, lipase, hCG unremarkable.  Urinalysis unremarkable with no evidence of dehydration or UTI. CT renal stone study showed no acute abnormality. It appears that her osteitis condensans ilia bilaterally could be attributing to her back pain suspect that her symptoms are musculoskeletal in nature.  I offered patient IV fluids to help with her weakness as well  as pain medication to help with her pain.  However, patient declined.  She would prefer just drinking water and having p.o. Medications.  Patient will be given Tylenol.  Vital signs remained stable. I doubt cauda equina, dissection or other surgical or emergent cause of symptoms.  Will advise patient to increase fluid intake and to follow-up with primary care provider for further evaluation.  Advised to return to ED for any severe worsening symptoms.  Portions of this note were generated with Scientist, clinical (histocompatibility and immunogenetics). Dictation errors may occur despite best attempts at proofreading.   Final Clinical Impressions(s) / ED Diagnoses   Final diagnoses:  Strain of lumbar region, initial encounter    ED Discharge Orders        Ordered    cyclobenzaprine (FLEXERIL) 10 MG tablet  2 times daily PRN     07/09/17 1422    naproxen (NAPROSYN) 500 MG tablet  2 times daily     07/09/17 1422       Dietrich Pates, PA-C 07/09/17 1433    Mackuen, Cindee Salt, MD 07/09/17 2015

## 2017-07-09 NOTE — Discharge Instructions (Signed)
Use over the counter Salonpas patches to help with pain.

## 2017-07-17 ENCOUNTER — Emergency Department (HOSPITAL_COMMUNITY)
Admission: EM | Admit: 2017-07-17 | Discharge: 2017-07-17 | Disposition: A | Discharge status: Home / Self Care | Payer: BLUE CROSS/BLUE SHIELD | Attending: Emergency Medicine | Admitting: Emergency Medicine

## 2017-07-17 ENCOUNTER — Encounter (HOSPITAL_COMMUNITY): Payer: Self-pay | Admitting: *Deleted

## 2017-07-17 DIAGNOSIS — R200 Anesthesia of skin: Secondary | ICD-10-CM | POA: Diagnosis not present

## 2017-07-17 DIAGNOSIS — Z7984 Long term (current) use of oral hypoglycemic drugs: Secondary | ICD-10-CM | POA: Insufficient documentation

## 2017-07-17 DIAGNOSIS — R0989 Other specified symptoms and signs involving the circulatory and respiratory systems: Secondary | ICD-10-CM | POA: Diagnosis present

## 2017-07-17 DIAGNOSIS — Z79899 Other long term (current) drug therapy: Secondary | ICD-10-CM | POA: Diagnosis not present

## 2017-07-17 DIAGNOSIS — E7800 Pure hypercholesterolemia, unspecified: Secondary | ICD-10-CM | POA: Insufficient documentation

## 2017-07-17 DIAGNOSIS — J029 Acute pharyngitis, unspecified: Secondary | ICD-10-CM | POA: Diagnosis not present

## 2017-07-17 DIAGNOSIS — R202 Paresthesia of skin: Secondary | ICD-10-CM

## 2017-07-17 LAB — BASIC METABOLIC PANEL
Anion gap: 8 (ref 5–15)
BUN: 9 mg/dL (ref 6–20)
CALCIUM: 8.9 mg/dL (ref 8.9–10.3)
CO2: 25 mmol/L (ref 22–32)
Chloride: 107 mmol/L (ref 101–111)
Creatinine, Ser: 0.56 mg/dL (ref 0.44–1.00)
GFR calc Af Amer: >60 mL/min (ref >60)
GLUCOSE: 137 mg/dL — AB (ref 65–99)
Potassium: 4 mmol/L (ref 3.5–5.1)
SODIUM: 140 mmol/L (ref 135–145)

## 2017-07-17 LAB — I-STAT CHEM 8, ED
BUN: 7 mg/dL (ref 6–20)
Calcium, Ion: 1.15 mmol/L (ref 1.15–1.40)
Chloride: 104 mmol/L (ref 101–111)
Creatinine, Ser: 0.5 mg/dL (ref 0.44–1.00)
Glucose, Bld: 135 mg/dL — ABNORMAL HIGH (ref 65–99)
HCT: 37 % (ref 36.0–46.0)
Hemoglobin: 12.6 g/dL (ref 12.0–15.0)
Potassium: 4 mmol/L (ref 3.5–5.1)
Sodium: 139 mmol/L (ref 135–145)
TCO2: 23 mmol/L (ref 22–32)

## 2017-07-17 LAB — CBC WITH DIFFERENTIAL/PLATELET
Basophils Absolute: 0 10*3/uL (ref 0.0–0.1)
Basophils Relative: 0 %
Eosinophils Absolute: 0 10*3/uL (ref 0.0–0.7)
Eosinophils Relative: 0 %
HCT: 37.4 % (ref 36.0–46.0)
Hemoglobin: 12.4 g/dL (ref 12.0–15.0)
Lymphocytes Relative: 15 %
Lymphs Abs: 1.4 10*3/uL (ref 0.7–4.0)
MCH: 28 pg (ref 26.0–34.0)
MCHC: 33.2 g/dL (ref 30.0–36.0)
MCV: 84.4 fL (ref 78.0–100.0)
Monocytes Absolute: 0.4 10*3/uL (ref 0.1–1.0)
Monocytes Relative: 5 %
Neutro Abs: 7.3 10*3/uL (ref 1.7–7.7)
Neutrophils Relative %: 80 %
Platelets: 189 10*3/uL (ref 150–400)
RBC: 4.43 MIL/uL (ref 3.87–5.11)
RDW: 13.5 % (ref 11.5–15.5)
WBC: 9.2 10*3/uL (ref 4.0–10.5)

## 2017-07-17 LAB — TSH: TSH: 1.8 u[IU]/mL (ref 0.350–4.500)

## 2017-07-17 MED ORDER — LORAZEPAM 1 MG PO TABS
1.0000 mg | ORAL_TABLET | Freq: Once | ORAL | Status: AC
  Administered 2017-07-17: 1 mg via ORAL
  Filled 2017-07-17: qty 1

## 2017-07-17 MED ORDER — LIDOCAINE VISCOUS HCL 2 % MT SOLN
15.0000 mL | Freq: Once | OROMUCOSAL | Status: DC
  Filled 2017-07-17: qty 15

## 2017-07-17 NOTE — ED Triage Notes (Signed)
Per EMS, they were called out for shortness of breath. Upon arrival, pt reported tightness in her throat. Pt did not appear to be in distress. Lung sounds clear, 100% SpO2. Pt A&O, ambulatory on scene.

## 2017-07-17 NOTE — ED Notes (Signed)
Patient verbalized understanding of discharge instructions. Patient ambulated out of ED with steady gait in no distress.

## 2017-07-17 NOTE — ED Provider Notes (Signed)
[facility]-EMERGENCY DEPT Provider Note   CSN: [id number] Arrival date & time: 07/17/17  1004     History   Chief Complaint Chief Complaint  Patient presents with  . Anxiety  . Oral Swelling    HPI [name] [name] is a 47 y.o. female.  47 yo F with a chief complaint of a feeling that her throat is closing.  Is been going on since last night.  She had some mango and felt that her tongue was tingling.  She also felt that things were painful when she swallowed and that she had pain like someone was choking her about her throat.  She denied a rash denied wheezing denied vomiting she had one loose stool last night.  No prior history of allergy to mango.  She took her medicines this morning and was able to swallow them fine.  She denies fevers or chills.  The history is provided by the patient.  Anxiety  Associated symptoms include shortness of breath. Pertinent negatives include no chest pain and no headaches.  Illness  This is a new problem. The current episode started yesterday. The problem occurs constantly. The problem has not changed since onset.Associated symptoms include shortness of breath. Pertinent negatives include no chest pain and no headaches. Nothing aggravates the symptoms. Nothing relieves the symptoms. She has tried nothing for the symptoms. The treatment provided no relief.    Past Medical History:  Diagnosis Date  . Anxiety   . Carpal tunnel syndrome   . Depression   . Dysrhythmia    palpitations with anxiety attacks  . Gestational diabetes   . Gestational diabetes requiring insulin 02/20/2012  . Hemorrhoids   . High cholesterol   . Hypercholesterolemia    takes meds when not pregnant  . No pertinent past medical history   . Normal pregnancy, repeat 09/15/2010  . SVD (spontaneous vaginal delivery) 09/15/2010    Patient Active Problem List   Diagnosis Date Noted  . Depression 06/25/2015  . Generalized anxiety disorder 06/25/2015  .  Neuralgia and neuritis 06/25/2015  . Gestational diabetes requiring insulin 02/20/2012    Past Surgical History:  Procedure Laterality Date  . CARPAL TUNNEL RELEASE Right 10/23/2016     OB History    Gravida  5   Para  4   Term  4   Preterm  0   AB  1   Living  4     SAB  1   TAB  0   Ectopic  0   Multiple  0   Live Births  4            Home Medications    Prior to Admission medications   Medication Sig Start Date End Date Taking? Authorizing Provider  atorvastatin (LIPITOR) 10 MG tablet Take 10 mg by mouth daily at 6 PM.  08/11/16 08/11/17  [provider]  cyclobenzaprine (FLEXERIL) 10 MG tablet Take 1 tablet (10 mg total) by mouth 2 (two) times daily as needed for muscle spasms. 07/09/17   Khatri, Hina, PA-C  fexofenadine (ALLEGRA) 180 MG tablet Take 180 mg by mouth daily as needed for allergies or rhinitis.    [provider]  FLUoxetine (PROZAC) 40 MG capsule Take 40 mg by mouth daily.    [provider]  losartan (COZAAR) 50 MG tablet Take 50 mg by mouth daily. 03/22/15   [provider]  metFORMIN (GLUCOPHAGE) 500 MG tablet Take 500 mg by mouth daily with breakfast.  [provider]  naproxen (NAPROSYN) 500 MG tablet Take 1 tablet (500 mg total) by mouth 2 (two) times daily. 07/09/17   Khatri, Hina, PA-C  pantoprazole (PROTONIX) 20 MG tablet Take 1 tablet (20 mg total) by mouth daily. 12/24/14   Arthor Captain, PA-C  rizatriptan (MAXALT) 10 MG tablet Take 1 tablet (10 mg total) by mouth as needed for migraine. May repeat in 2 hours. Maximum twice in one day. 03/02/17   Anson Fret, MD    Family History Family History  Problem Relation Age of Onset  . Cancer Mother        brain  . Hypertension Mother   . Hypertension Father   . Anxiety disorder Sister   . Depression Sister   . Hypertension Sister   . Diabetes Brother   . Migraines Neg Hx     Social History Social History   Tobacco Use  . Smoking  status: Never Smoker  . Smokeless tobacco: Never Used  Substance Use Topics  . Alcohol use: No  . Drug use: No     Allergies   Amoxicillin; Doxycycline; and Penicillins   Review of Systems Review of Systems  Constitutional: Negative for chills and fever.  HENT: Positive for sore throat. Negative for congestion and rhinorrhea.   Eyes: Negative for redness and visual disturbance.  Respiratory: Positive for choking and shortness of breath. Negative for wheezing.   Cardiovascular: Negative for chest pain and palpitations.  Gastrointestinal: Negative for nausea and vomiting.  Genitourinary: Negative for dysuria and urgency.  Musculoskeletal: Negative for arthralgias and myalgias.  Skin: Negative for pallor and wound.  Neurological: Positive for numbness (tingling through her whole body). Negative for dizziness and headaches.     Physical Exam Updated Vital Signs BP (!) 189/103 (BP Location: Left Arm)   Pulse 82   Temp 98.6 F (37 C) (Oral)   Resp 16   LMP 06/23/2017   SpO2 98%   Physical Exam  Constitutional: She is oriented to person, place, and time. She appears well-developed and well-nourished. No distress.  HENT:  Head: Normocephalic and atraumatic.  Points to her thyroid as area of pain.  No noted masses, lesions.    No noted erythema to the posterior oropharynx.  She is turning her head back and forth without difficulty tolerating secretions without difficulty.  Swollen turbinates,no noted sinus ttp, tm normal bilaterally.    Eyes: Pupils are equal, round, and reactive to light. EOM are normal.  Neck: Normal range of motion. Neck supple.  Cardiovascular: Normal rate and regular rhythm. Exam reveals no gallop and no friction rub.  No murmur heard. Pulmonary/Chest: Effort normal. She has no wheezes. She has no rales.  Abdominal: Soft. She exhibits no distension. There is no tenderness.  Musculoskeletal: She exhibits no edema or tenderness.  Neurological: She is  alert and oriented to person, place, and time.  Skin: Skin is warm and dry. She is not diaphoretic.  Psychiatric: She has a normal mood and affect. Her behavior is normal.  Nursing note and vitals reviewed.    ED Treatments / Results  Labs (all labs ordered are listed, but only abnormal results are displayed) Labs Reviewed  BASIC METABOLIC PANEL - Abnormal; Notable for the following components:      Result Value   Glucose, Bld 137 (*)    All other components within normal limits  I-STAT CHEM 8, ED - Abnormal; Notable for the following components:   Glucose, Bld 135 (*)  All other components within normal limits  CBC WITH DIFFERENTIAL/PLATELET  TSH  T4    EKG None  Radiology No results found.  Procedures Procedures (including critical care time)  Medications Ordered in ED Medications  lidocaine (XYLOCAINE) 2 % viscous mouth solution 15 mL (15 mLs Mouth/Throat Refused 07/17/17 1146)  LORazepam (ATIVAN) tablet 1 mg (1 mg Oral Given 07/17/17 1142)     Initial Impression / Assessment and Plan / ED Course  I have reviewed the triage vital signs and the nursing notes.  Pertinent labs & imaging results that were available during my care of the patient were reviewed by me and considered in my medical decision making (see chart for details).     47 yo F with a cc of a sensation that her throat was closing.  The patient has a history of anxiety.  She is never had tongue swelling or feeling that her throat was closing previously.  My exam the patient is well-appearing and nontoxic.  She is able to rotate her head without any issue.  Tolerating her secretions no concerning findings on physical exam.  Patient is tender over her thyroid and so I will obtain thyroid studies.  Give a dose of lidocaine and Ativan and reassess.  It seems unlikely that the patient has a retropharyngeal abscess or epiglottitis.  She may have an esophageal foreign body though she is able to tolerate  secretions without difficulty.  I do not feel that she needs an urgent endoscopy.  If laboratory evaluation is unremarkable would refer her to gastroenterology.  Electrolytes and thyroid study unremarkable.  Discharge home.  1:23 PM:  I have discussed the diagnosis/risks/treatment options with the patient and believe the pt to be eligible for discharge home to follow-up with PCP, GI. We also discussed returning to the ED immediately if new or worsening sx occur. We discussed the sx which are most concerning (e.g., sudden worsening pain, fever, inability to tolerate by mouth) that necessitate immediate return. Medications administered to the patient during their visit and any new prescriptions provided to the patient are listed below.  Medications given during this visit Medications  lidocaine (XYLOCAINE) 2 % viscous mouth solution 15 mL (15 mLs Mouth/Throat Refused 07/17/17 1146)  LORazepam (ATIVAN) tablet 1 mg (1 mg Oral Given 07/17/17 1142)     The patient appears reasonably screen and/or stabilized for discharge and I doubt any other medical condition or other [facility] requiring further screening, evaluation, or treatment in the ED at this time prior to discharge.    Final Clinical Impressions(s) / ED Diagnoses   Final diagnoses:  Sore throat  Paresthesias    ED Discharge Orders    None       Melene PlanFloyd, Teala Daffron, DO 07/17/17 1324

## 2017-07-18 LAB — T4: T4, Total: 11.5 ug/dL (ref 4.5–12.0)

## 2017-08-17 ENCOUNTER — Ambulatory Visit: Payer: Self-pay | Admitting: General Surgery

## 2017-08-17 NOTE — H&P (Signed)
History of Present Illness [name] [name](Kristy Schomburg MD; 08/17/2017 11:46 AM) The patient is a 47 year old female who presents with a complaint of Rectal bleeding. 47 year old female who presents to the office for evaluation of rectal bleeding. She underwent a colonoscopy for this approximately 2 years ago. Several polyps were found but no other source was noted. She was seen by Dr. Antonieta Pertameriz here in the office in March 2018 and rubber band ligation was performed. She states that ever since then she has continued to have rectal bleeding on a daily basis. She reports regular bowel habits and denies any straining.   Problem List/Past Medical [name] [name](Jahlani Lorentz, MD; 08/17/2017 11:44 AM) PROLAPSED INTERNAL HEMORRHOIDS, GRADE 3 (Z61.0(K64.2)  Past Surgical History [name] [name](Lakenya Riendeau, MD; 08/17/2017 11:44 AM) Colon Polyp Removal - Colonoscopy  Diagnostic Studies History [name] [name](Zayley Arras, MD; 08/17/2017 11:44 AM) Colonoscopy 1-5 years ago Mammogram within last year Pap Smear 1-5 years ago  Allergies (Tanisha A. Manson PasseyBrown, RMA; 08/17/2017 11:32 AM) Amoxicillin *PENICILLINS* Allergies Reconciled  Medication History (Tanisha A. Manson PasseyBrown, RMA; 08/17/2017 11:32 AM) Atorvastatin Calcium (10MG  Tablet, Oral) Active. FLUoxetine HCl (40MG  Capsule, Oral) Active. Losartan Potassium (50MG  Tablet, Oral) Active. Topiramate (25MG  Tablet, Oral) Active. Pantoprazole Sodium (40MG  Tablet DR, Oral) Active. LORazepam (1MG  Tablet, Oral) Active. Medications Reconciled  Social History [name] [name](Eulice Rutledge, MD; 08/17/2017 11:44 AM) No alcohol use No caffeine use No drug use Tobacco use Never smoker.  Family History [name] [name](Romney Compean, MD; 08/17/2017 11:44 AM) Diabetes Mellitus Brother, Sister. Heart Disease Sister.  Pregnancy / Birth History [name] [name](Desmin Daleo, MD; 08/17/2017 11:44 AM) Age at menarche 15 years. Gravida 5 Length (months) of breastfeeding 3-6 Maternal age 45-30 Para 4 Regular periods  Other Problems [name] [name](Kele Barthelemy, MD; 08/17/2017 11:44 AM) Anxiety Disorder Depression Gastroesophageal Reflux Disease Hemorrhoids High blood pressure Hypercholesterolemia Migraine Headache INTERNAL HEMORRHOID (K64.8) [04/21/2016]:     Review of Systems [name] [name](Lara Palinkas MD; 08/17/2017 11:44 AM) General Present- Fatigue. Not Present- Appetite Loss, Chills, Fever, Night Sweats, Weight Gain and Weight Loss. Skin Present- Dryness. Not Present- Change in Wart/Mole, Hives, Jaundice, New Lesions, Non-Healing Wounds, Rash and Ulcer. HEENT Present- Seasonal Allergies and Wears glasses/contact lenses. Not Present- Earache, Hearing Loss, Hoarseness, Nose Bleed, Oral Ulcers, Ringing in the Ears, Sinus Pain, Sore Throat, Visual Disturbances and Yellow Eyes. Breast Not Present- Breast Mass, Breast Pain, Nipple Discharge and Skin Changes. Cardiovascular Not Present- Chest Pain, Difficulty Breathing Lying Down, Leg Cramps, Palpitations, Rapid Heart Rate, Shortness of Breath and Swelling of Extremities. Gastrointestinal Present- Hemorrhoids. Not Present- Abdominal Pain, Bloating, Bloody Stool, Change in Bowel Habits, Chronic diarrhea, Constipation, Difficulty Swallowing, Excessive gas, Gets full quickly at meals, Indigestion, Nausea, Rectal Pain and Vomiting. Female Genitourinary Not Present- Frequency, Nocturia, Painful Urination, Pelvic Pain and Urgency. Musculoskeletal Present- Joint Stiffness. Not Present- Back Pain, Joint Pain, Muscle Pain, Muscle Weakness and Swelling of Extremities. Neurological Present- Numbness and Tingling. Not Present- Decreased Memory, Fainting, Headaches, Seizures, Tremor, Trouble walking and Weakness. Psychiatric Present- Anxiety. Not Present- Bipolar, Change in Sleep Pattern, Depression, Fearful and Frequent crying. Endocrine Not Present- Cold Intolerance, Excessive Hunger, Hair Changes, Heat Intolerance, Hot flashes and New Diabetes. Hematology Not Present- Blood Thinners, Easy Bruising, Excessive  bleeding, Gland problems, HIV and Persistent Infections.  Vitals (Tanisha A. Brown RMA; 08/17/2017 11:32 AM) 08/17/2017 11:31 AM Weight: 122.6 lb Height: 60in Body Surface Area: 1.52 m Body Mass Index: 23.94 kg/m  Temp.: 98.38F  Pulse: 83 (Regular)  BP: 132/82 (Sitting, Left Arm, Standard)      Physical Exam [name] [name](Zaia Carre  MD; 08/17/2017 11:45 AM)  General Mental Status-Alert. General Appearance-Consistent with stated age. Hydration-Well hydrated. Voice-Normal.  Head and Neck Head-normocephalic, atraumatic with no lesions or palpable masses.  Chest and Lung Exam Chest and lung exam reveals -quiet, even and easy respiratory effort with no use of accessory muscles, normal resonance, no flatness or dullness, non-tender and normal tactile fremitus and on auscultation, normal breath sounds, no adventitious sounds and normal vocal resonance. Inspection Chest Wall - Normal. Back - normal.  Cardiovascular Cardiovascular examination reveals -on palpation PMI is normal in location and amplitude, no palpable S3 or S4. Normal cardiac borders., normal heart sounds, regular rate and rhythm with no murmurs, carotid auscultation reveals no bruits and normal pedal pulses bilaterally.  Abdomen Inspection Inspection of the abdomen reveals - No Hernias. Skin - Scar - no surgical scars. Palpation/Percussion Palpation and Percussion of the abdomen reveal - Soft, Non Tender, No Rebound tenderness, No Rigidity (guarding) and No hepatosplenomegaly.  Rectal Anorectal Exam External - skin tag. Internal - normal sphincter tone and internal hemorrhoids(At the 5 o'clock position). Proctoscopic exam-Internal hemorrhoids.  Neurologic Neurologic evaluation reveals -alert and oriented x 3 with no impairment of recent or remote memory. Mental Status-Normal.  Musculoskeletal Normal Exam - Left-Upper Extremity Strength Normal and Lower Extremity Strength Normal. Normal  Exam - Right-Upper Extremity Strength Normal, Lower Extremity Weakness.   Results [name] Levee MD; 08/17/2017 11:44 AM) Procedures  Name Value Date ANOSCOPY, DIAGNOSTIC (16109) [ Hemorrhoids ] Procedure Other: Procedure: Anoscopy....[name] KitchenMarland KitchenSurgeon: Maisie Fus....[name] KitchenMarland KitchenAfter the risks and benefits were explained, verbal consent was obtained for above procedure. A medical assistant chaperone was present thoroughout the entire procedure. ....[name] KitchenMarland KitchenAnesthesia: none....[name] KitchenMarland KitchenDiagnosis: Rectal bleeding....[name] KitchenMarland KitchenFindings: Grade 3 right posterior internal hemorrhoid. No other internal hemorrhoid disease noted.  Performed: 08/17/2017 11:43 AM    Assessment & Plan [name] Levee MD; 08/17/2017 11:43 AM)  PROLAPSED INTERNAL HEMORRHOIDS, GRADE 3 (U04.5) Impression: 47 year old female who presents to the office with recurrent rectal bleeding. She has failed rectal suppositories as well as rubber band ligation. On exam she has one grade 3 right posterior internal hemorrhoid that seems to be the cause of her bleeding. I have recommended that she undergo a hemorrhoidectomy. We have discussed this in detail including postoperative pain. She would like to proceed with surgery. She would like to do this after Labor Day. We will schedule this at her convenience.

## 2017-09-30 IMAGING — CT CT HEAD W/O CM
4 series · 15 of 47 positions shown, 17 images · non-contrast
Comparison: 05/15/2015

CLINICAL DATA: Headaches

EXAM:
CT HEAD WITHOUT CONTRAST
TECHNIQUE: Contiguous axial images were obtained from the base of the skull
through the vertex without intravenous contrast.

[Series 2: head without · axial · non-contrast · 0.41mm/px · z∈[-42,+68]mm · 7 of 30 slices shown, 9 images]
[im 4/30  brain]
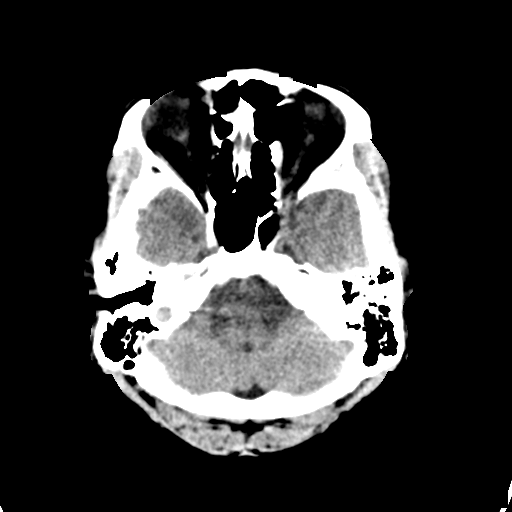
[im 4/30  bone]
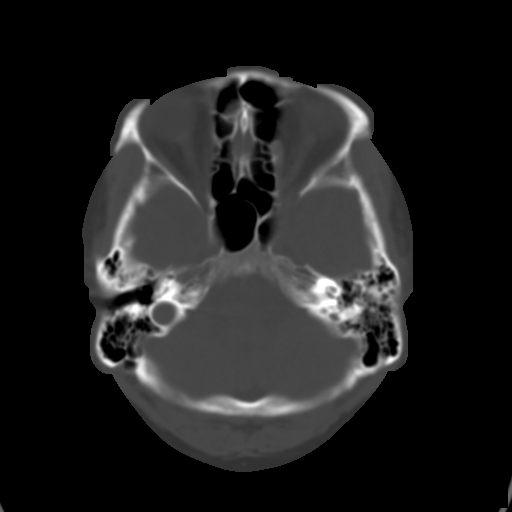
[im 8/30  brain]
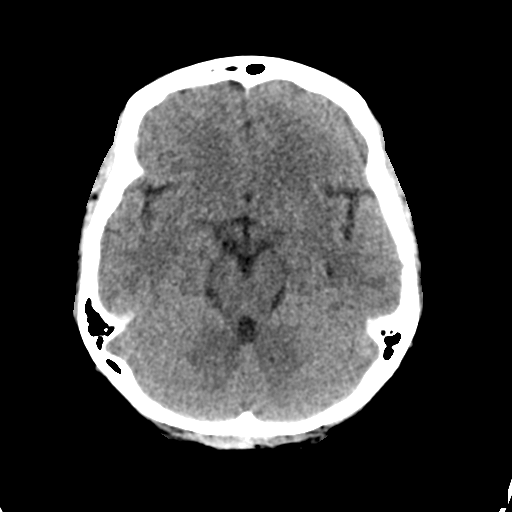
[im 11/30  brain]
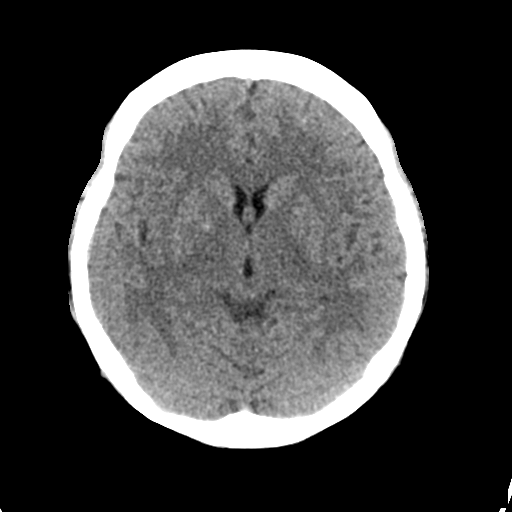
[im 15/30  brain]
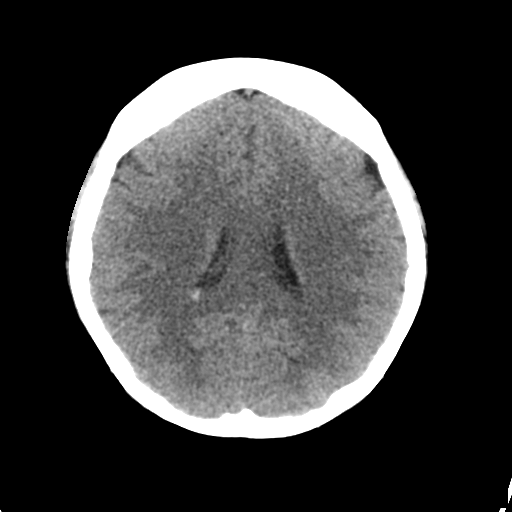
[im 19/30  brain]
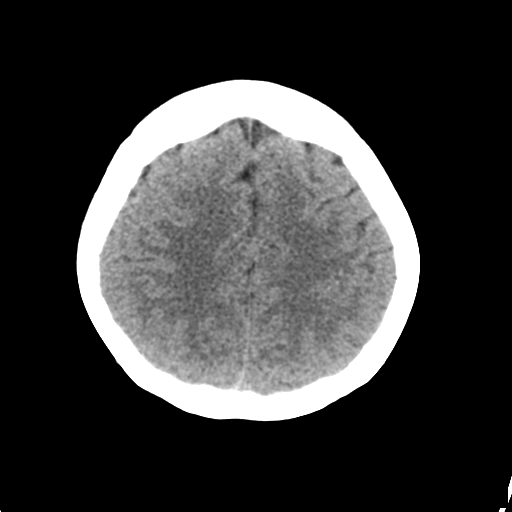
[im 19/30  bone]
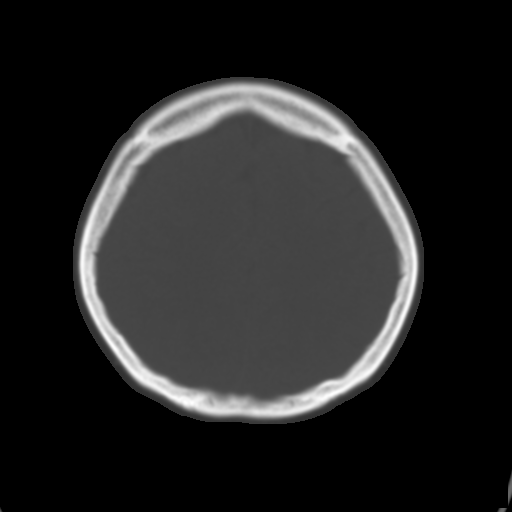
[im 22/30  brain]
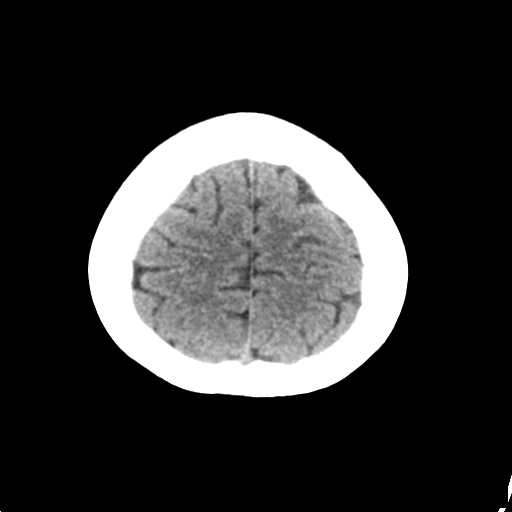
[im 26/30  brain]
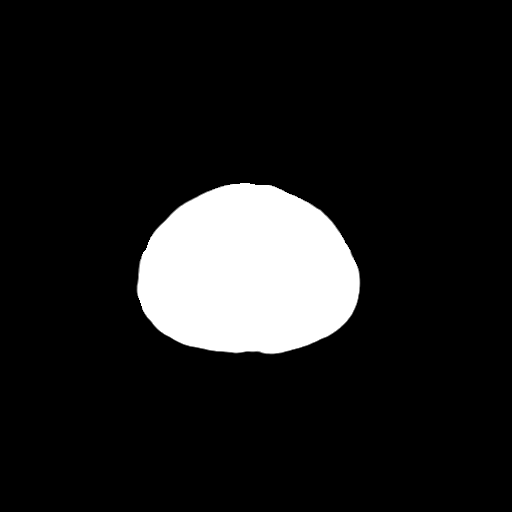

[Series 3: head bone · axial · 0.41mm/px · z∈[-43,-29]mm · 2 of 74 slices shown]
[im 8/74  bone]
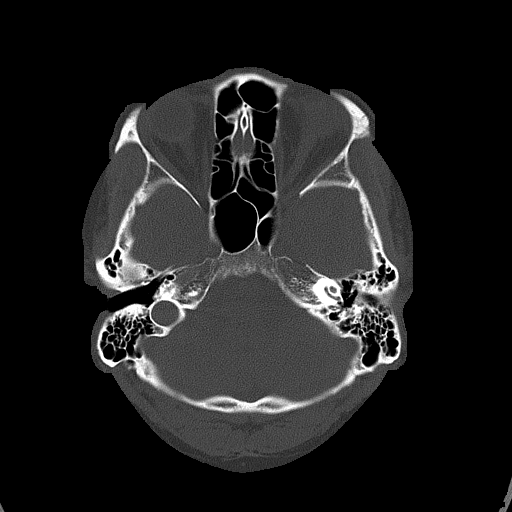
[im 15/74  bone]
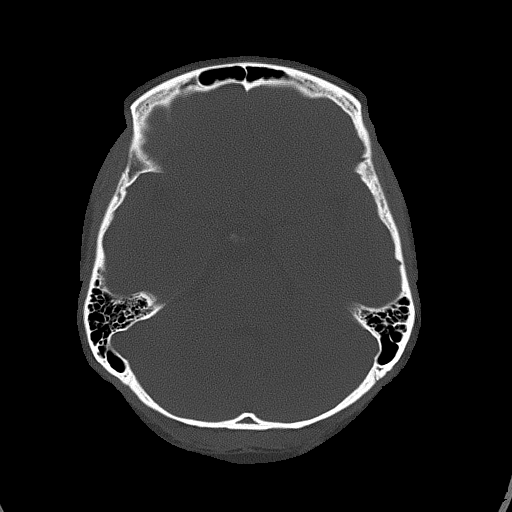

[Series 4: head without cor · coronal · non-contrast · 0.29mm/px · 3 of 53 slices shown]
[im 18/53  brain]
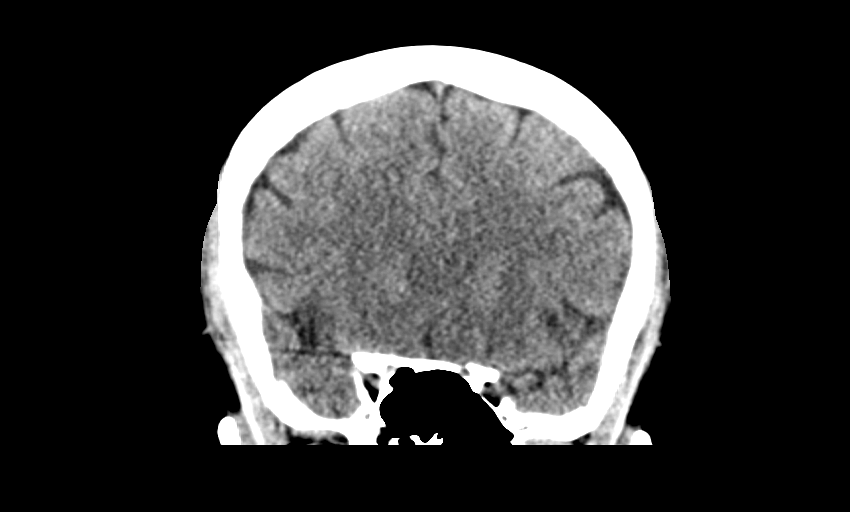
[im 24/53  brain]
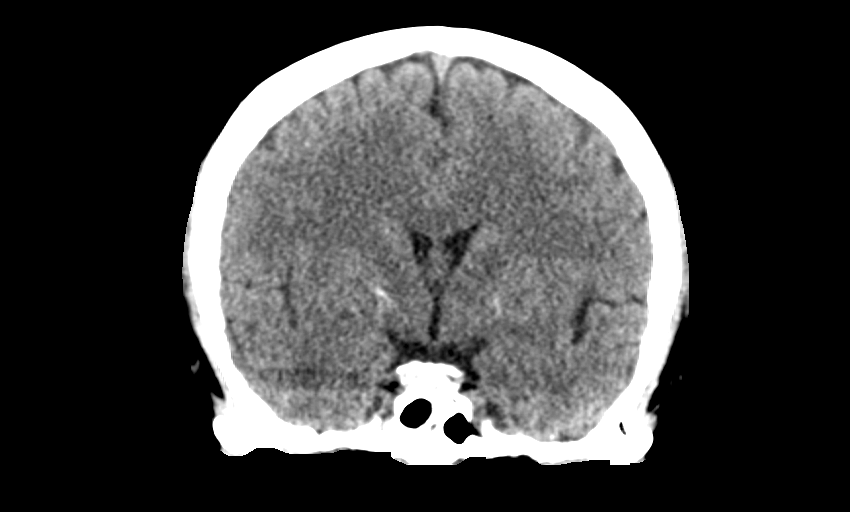
[im 29/53  brain]
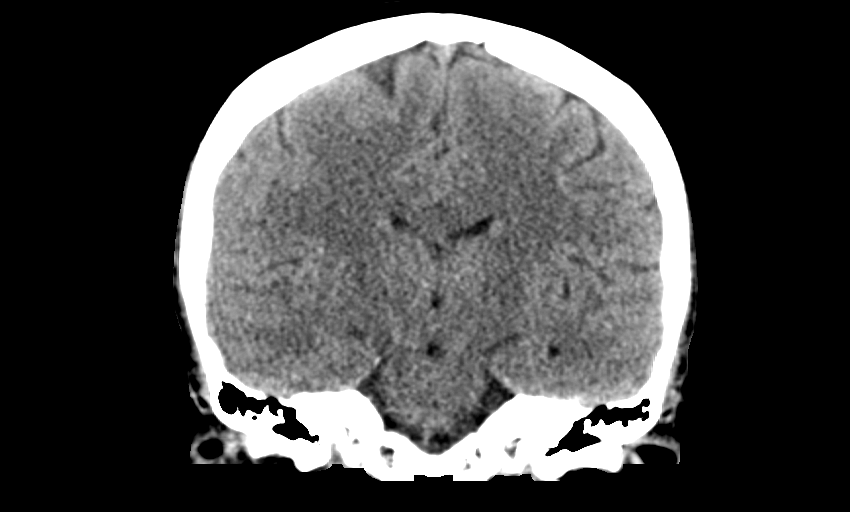

[Series 5: head without sag · sagittal · non-contrast · 0.29mm/px · 3 of 58 slices shown]
[im 20/58  brain]
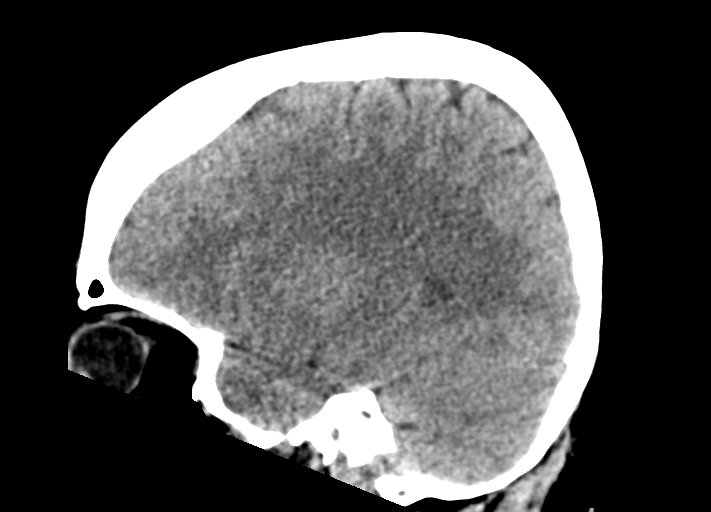
[im 29/58  brain]
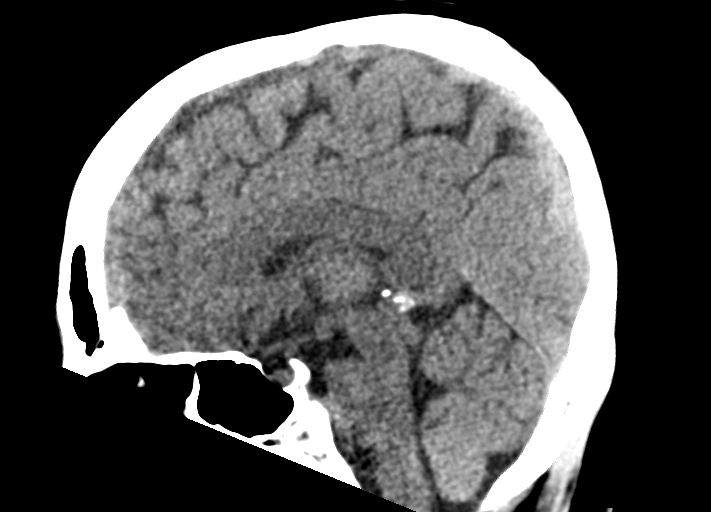
[im 39/58  brain]
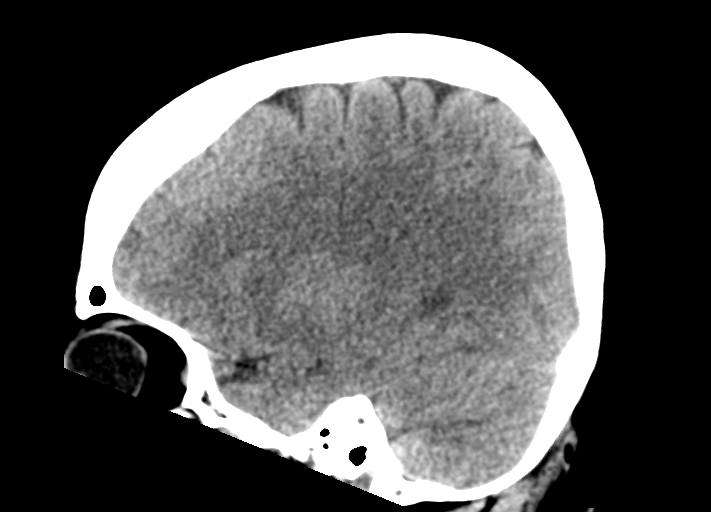

[15 of 47 positions shown; findings below may reference images not displayed]

FINDINGS: Brain: No evidence of acute infarction, hemorrhage, hydrocephalus,
extra-axial collection or mass lesion/mass effect. Basal ganglia
calcifications are noted

Vascular: No hyperdense vessel or unexpected calcification.

Skull: Normal. Negative for fracture or focal lesion.

Sinuses/Orbits: No acute finding.

Other: None.
IMPRESSION: No acute abnormality noted.

## 2017-10-06 ENCOUNTER — Encounter (HOSPITAL_BASED_OUTPATIENT_CLINIC_OR_DEPARTMENT_OTHER): Payer: Self-pay

## 2017-10-11 ENCOUNTER — Encounter (HOSPITAL_BASED_OUTPATIENT_CLINIC_OR_DEPARTMENT_OTHER): Payer: Self-pay

## 2017-10-11 ENCOUNTER — Other Ambulatory Visit: Payer: Self-pay

## 2017-10-11 NOTE — Progress Notes (Signed)
Spoke with:  [name] [name]:  After Midnight, no gum, candy, or mints   Arrival time:  0630AM Labs: Istat 4, UPT (EKG 07/09/2017 chart/epic) AM medications:  Fluoxetine, Pantoprazole  (patient stated she may just wait til after surgery to resume medications) Pre op orders: Yes Ride home:  Eudelia Bunch (husband) [phone]

## 2017-10-11 NOTE — Progress Notes (Signed)
Spoke clear English via telephone for interview.

## 2017-10-12 MED ORDER — BUPIVACAINE LIPOSOME 1.3 % IJ SUSP
20.0000 mL | Freq: Once | INTRAMUSCULAR | Status: DC
  Filled 2017-10-12: qty 20

## 2017-10-12 NOTE — Anesthesia Preprocedure Evaluation (Addendum)
Anesthesia Evaluation  Patient identified by MRN, date of birth, ID band Patient awake    Reviewed: Allergy & Precautions, NPO status , Patient's Chart, lab work & pertinent test results  Airway Mallampati: II  TM Distance: >3 FB Neck ROM: Full    Dental   Pulmonary neg pulmonary ROS,    breath sounds clear to auscultation       Cardiovascular hypertension, Pt. on medications  Rhythm:Regular Rate:Normal     Neuro/Psych  Headaches, Anxiety Depression    GI/Hepatic Neg liver ROS, GERD  ,  Endo/Other  diabetes, Type 2, Oral Hypoglycemic Agents  Renal/GU negative Renal ROS     Musculoskeletal   Abdominal   Peds  Hematology negative hematology ROS (+)   Anesthesia Other Findings   Reproductive/Obstetrics                            Lab Results  Component Value Date   WBC 9.2 07/17/2017   HGB 12.6 07/17/2017   HCT 37.0 07/17/2017   MCV 84.4 07/17/2017   PLT 189 07/17/2017   Lab Results  Component Value Date   CREATININE 0.50 07/17/2017   BUN 7 07/17/2017   NA 139 07/17/2017   K 4.0 07/17/2017   CL 104 07/17/2017   CO2 25 07/17/2017    Anesthesia Physical Anesthesia Plan  ASA: II  Anesthesia Plan: MAC   Post-op Pain Management:    Induction: Intravenous  PONV Risk Score and Plan: 2 and Propofol infusion, Ondansetron and Treatment may vary due to age or medical condition  Airway Management Planned: Natural Airway, Simple Face Mask and Nasal Cannula  Additional Equipment:   Intra-op Plan:   Post-operative Plan:   Informed Consent: I have reviewed the patients History and Physical, chart, labs and discussed the procedure including the risks, benefits and alternatives for the proposed anesthesia with the patient or authorized representative who has indicated his/her understanding and acceptance.     Plan Discussed with: CRNA  Anesthesia Plan Comments:         Anesthesia Quick Evaluation

## 2017-10-13 ENCOUNTER — Ambulatory Visit (HOSPITAL_BASED_OUTPATIENT_CLINIC_OR_DEPARTMENT_OTHER)
Admission: RE | Admit: 2017-10-13 | Discharge: 2017-10-13 | Disposition: A | Discharge status: Home / Self Care | Payer: BLUE CROSS/BLUE SHIELD | Source: Ambulatory Visit | Attending: General Surgery | Admitting: General Surgery

## 2017-10-13 ENCOUNTER — Ambulatory Visit (HOSPITAL_BASED_OUTPATIENT_CLINIC_OR_DEPARTMENT_OTHER): Payer: BLUE CROSS/BLUE SHIELD | Admitting: Anesthesiology

## 2017-10-13 ENCOUNTER — Encounter (HOSPITAL_BASED_OUTPATIENT_CLINIC_OR_DEPARTMENT_OTHER): Payer: Self-pay

## 2017-10-13 ENCOUNTER — Encounter (HOSPITAL_BASED_OUTPATIENT_CLINIC_OR_DEPARTMENT_OTHER)
Admission: RE | Disposition: A | Discharge status: Home / Self Care | Payer: Self-pay | Source: Ambulatory Visit | Attending: General Surgery

## 2017-10-13 DIAGNOSIS — K642 Third degree hemorrhoids: Secondary | ICD-10-CM | POA: Insufficient documentation

## 2017-10-13 DIAGNOSIS — Z881 Allergy status to other antibiotic agents status: Secondary | ICD-10-CM | POA: Insufficient documentation

## 2017-10-13 DIAGNOSIS — Z880 Allergy status to penicillin: Secondary | ICD-10-CM | POA: Diagnosis not present

## 2017-10-13 DIAGNOSIS — E119 Type 2 diabetes mellitus without complications: Secondary | ICD-10-CM | POA: Insufficient documentation

## 2017-10-13 DIAGNOSIS — F419 Anxiety disorder, unspecified: Secondary | ICD-10-CM | POA: Diagnosis not present

## 2017-10-13 DIAGNOSIS — F329 Major depressive disorder, single episode, unspecified: Secondary | ICD-10-CM | POA: Diagnosis not present

## 2017-10-13 DIAGNOSIS — Z8249 Family history of ischemic heart disease and other diseases of the circulatory system: Secondary | ICD-10-CM | POA: Insufficient documentation

## 2017-10-13 DIAGNOSIS — I10 Essential (primary) hypertension: Secondary | ICD-10-CM | POA: Diagnosis not present

## 2017-10-13 DIAGNOSIS — Z79899 Other long term (current) drug therapy: Secondary | ICD-10-CM | POA: Diagnosis not present

## 2017-10-13 DIAGNOSIS — E7800 Pure hypercholesterolemia, unspecified: Secondary | ICD-10-CM | POA: Insufficient documentation

## 2017-10-13 DIAGNOSIS — Z7984 Long term (current) use of oral hypoglycemic drugs: Secondary | ICD-10-CM | POA: Insufficient documentation

## 2017-10-13 DIAGNOSIS — K219 Gastro-esophageal reflux disease without esophagitis: Secondary | ICD-10-CM | POA: Insufficient documentation

## 2017-10-13 HISTORY — DX: Migraine, unspecified, not intractable, without status migrainosus: G43.909

## 2017-10-13 HISTORY — DX: Third degree hemorrhoids: K64.2

## 2017-10-13 HISTORY — DX: Anesthesia of skin: R20.0

## 2017-10-13 HISTORY — DX: Other seasonal allergic rhinitis: J30.2

## 2017-10-13 HISTORY — DX: Personal history of colonic polyps: Z86.010

## 2017-10-13 HISTORY — DX: Paresthesia of skin: R20.2

## 2017-10-13 HISTORY — DX: Personal history of other diseases of the digestive system: Z87.19

## 2017-10-13 HISTORY — DX: Other fatigue: R53.83

## 2017-10-13 HISTORY — DX: Gastro-esophageal reflux disease without esophagitis: K21.9

## 2017-10-13 HISTORY — DX: Personal history of colon polyps, unspecified: Z86.0100

## 2017-10-13 HISTORY — PX: HEMORRHOID SURGERY: SHX153

## 2017-10-13 HISTORY — DX: Cerebral cysts: G93.0

## 2017-10-13 HISTORY — DX: Panic disorder (episodic paroxysmal anxiety): F41.0

## 2017-10-13 HISTORY — DX: Essential (primary) hypertension: I10

## 2017-10-13 HISTORY — DX: Personal history of other diseases of the musculoskeletal system and connective tissue: Z87.39

## 2017-10-13 LAB — POCT I-STAT, CHEM 8
BUN: 13 mg/dL (ref 6–20)
Calcium, Ion: 1.17 mmol/L (ref 1.15–1.40)
Chloride: 104 mmol/L (ref 98–111)
Creatinine, Ser: 0.6 mg/dL (ref 0.44–1.00)
Glucose, Bld: 150 mg/dL — ABNORMAL HIGH (ref 70–99)
HCT: 36 % (ref 36.0–46.0)
Hemoglobin: 12.2 g/dL (ref 12.0–15.0)
POTASSIUM: 3.8 mmol/L (ref 3.5–5.1)
SODIUM: 140 mmol/L (ref 135–145)
TCO2: 25 mmol/L (ref 22–32)

## 2017-10-13 LAB — GLUCOSE, CAPILLARY: GLUCOSE-CAPILLARY: 138 mg/dL — AB (ref 70–99)

## 2017-10-13 SURGERY — HEMORRHOIDECTOMY
Anesthesia: Monitor Anesthesia Care

## 2017-10-13 MED ORDER — LIDOCAINE 5 % EX OINT
TOPICAL_OINTMENT | CUTANEOUS | Status: DC | PRN
  Administered 2017-10-13: 1

## 2017-10-13 MED ORDER — LIDOCAINE 2% (20 MG/ML) 5 ML SYRINGE
INTRAMUSCULAR | Status: AC
  Filled 2017-10-13: qty 5

## 2017-10-13 MED ORDER — SODIUM CHLORIDE 0.9 % IV SOLN
250.0000 mL | INTRAVENOUS | Status: DC | PRN
  Filled 2017-10-13: qty 250

## 2017-10-13 MED ORDER — ACETAMINOPHEN 325 MG PO TABS
650.0000 mg | ORAL_TABLET | ORAL | Status: DC | PRN
  Filled 2017-10-13: qty 2

## 2017-10-13 MED ORDER — MIDAZOLAM HCL 2 MG/2ML IJ SOLN
INTRAMUSCULAR | Status: DC | PRN
  Administered 2017-10-13: 0.5 mg via INTRAVENOUS

## 2017-10-13 MED ORDER — ACETAMINOPHEN 500 MG PO TABS
ORAL_TABLET | ORAL | Status: AC
  Filled 2017-10-13: qty 2

## 2017-10-13 MED ORDER — PROPOFOL 500 MG/50ML IV EMUL
INTRAVENOUS | Status: DC | PRN
  Administered 2017-10-13: 150 ug/kg/min via INTRAVENOUS

## 2017-10-13 MED ORDER — FENTANYL CITRATE (PF) 100 MCG/2ML IJ SOLN
INTRAMUSCULAR | Status: AC
  Filled 2017-10-13: qty 2

## 2017-10-13 MED ORDER — ACETAMINOPHEN 650 MG RE SUPP
650.0000 mg | RECTAL | Status: DC | PRN
  Filled 2017-10-13: qty 1

## 2017-10-13 MED ORDER — FENTANYL CITRATE (PF) 100 MCG/2ML IJ SOLN
25.0000 ug | INTRAMUSCULAR | Status: DC | PRN
  Filled 2017-10-13: qty 1

## 2017-10-13 MED ORDER — OXYCODONE HCL 5 MG PO TABS
5.0000 mg | ORAL_TABLET | ORAL | Status: DC | PRN
  Filled 2017-10-13: qty 2

## 2017-10-13 MED ORDER — PROPOFOL 10 MG/ML IV BOLUS
INTRAVENOUS | Status: DC | PRN
  Administered 2017-10-13: 40 mg via INTRAVENOUS

## 2017-10-13 MED ORDER — GABAPENTIN 300 MG PO CAPS
300.0000 mg | ORAL_CAPSULE | ORAL | Status: AC
  Administered 2017-10-13: 300 mg via ORAL
  Filled 2017-10-13: qty 1

## 2017-10-13 MED ORDER — LIDOCAINE 2% (20 MG/ML) 5 ML SYRINGE
INTRAMUSCULAR | Status: DC | PRN
  Administered 2017-10-13: 50 mg via INTRAVENOUS

## 2017-10-13 MED ORDER — PROPOFOL 500 MG/50ML IV EMUL
INTRAVENOUS | Status: AC
  Filled 2017-10-13: qty 50

## 2017-10-13 MED ORDER — SODIUM CHLORIDE 0.9% FLUSH
3.0000 mL | Freq: Two times a day (BID) | INTRAVENOUS | Status: DC
  Filled 2017-10-13: qty 3

## 2017-10-13 MED ORDER — LACTATED RINGERS IV SOLN
INTRAVENOUS | Status: DC
  Administered 2017-10-13: 08:00:00 via INTRAVENOUS
  Filled 2017-10-13: qty 1000

## 2017-10-13 MED ORDER — SODIUM CHLORIDE 0.9% FLUSH
3.0000 mL | INTRAVENOUS | Status: DC | PRN
  Filled 2017-10-13: qty 3

## 2017-10-13 MED ORDER — CELECOXIB 200 MG PO CAPS
ORAL_CAPSULE | ORAL | Status: AC
  Filled 2017-10-13: qty 1

## 2017-10-13 MED ORDER — GABAPENTIN 300 MG PO CAPS
ORAL_CAPSULE | ORAL | Status: AC
  Filled 2017-10-13: qty 1

## 2017-10-13 MED ORDER — BUPIVACAINE-EPINEPHRINE 0.5% -1:200000 IJ SOLN
INTRAMUSCULAR | Status: DC | PRN
  Administered 2017-10-13: 30 mL

## 2017-10-13 MED ORDER — MIDAZOLAM HCL 2 MG/2ML IJ SOLN
INTRAMUSCULAR | Status: AC
  Filled 2017-10-13: qty 2

## 2017-10-13 MED ORDER — FENTANYL CITRATE (PF) 100 MCG/2ML IJ SOLN
INTRAMUSCULAR | Status: DC | PRN
  Administered 2017-10-13: 25 ug via INTRAVENOUS

## 2017-10-13 MED ORDER — ACETAMINOPHEN 500 MG PO TABS
1000.0000 mg | ORAL_TABLET | ORAL | Status: AC
  Administered 2017-10-13: 1000 mg via ORAL
  Filled 2017-10-13: qty 2

## 2017-10-13 MED ORDER — HYDROCODONE-ACETAMINOPHEN 5-325 MG PO TABS: 1.0000 | ORAL_TABLET | ORAL | 0 refills | Status: DC | PRN

## 2017-10-13 MED ORDER — PROMETHAZINE HCL 25 MG/ML IJ SOLN
6.2500 mg | INTRAMUSCULAR | Status: DC | PRN
  Filled 2017-10-13: qty 1

## 2017-10-13 MED ORDER — BUPIVACAINE LIPOSOME 1.3 % IJ SUSP
INTRAMUSCULAR | Status: DC | PRN
  Administered 2017-10-13: 20 mL

## 2017-10-13 MED ORDER — CELECOXIB 200 MG PO CAPS
200.0000 mg | ORAL_CAPSULE | ORAL | Status: AC
  Administered 2017-10-13: 200 mg via ORAL
  Filled 2017-10-13: qty 1

## 2017-10-13 SURGICAL SUPPLY — 41 items
BLADE EXTENDED COATED 6.5IN (ELECTRODE) IMPLANT
BLADE HEX COATED 2.75 (ELECTRODE) ×3 IMPLANT
BLADE SURG 10 STRL SS (BLADE) IMPLANT
BLADE SURG 15 STRL LF DISP TIS (BLADE) IMPLANT
BLADE SURG 15 STRL SS (BLADE)
BRIEF STRETCH FOR OB PAD LRG (UNDERPADS AND DIAPERS) IMPLANT
COVER BACK TABLE 60X90IN (DRAPES) ×3 IMPLANT
COVER MAYO STAND STRL (DRAPES) ×3 IMPLANT
DRAPE LAPAROTOMY 100X72 PEDS (DRAPES) ×3 IMPLANT
DRAPE UTILITY XL STRL (DRAPES) ×3 IMPLANT
ELECT REM PT RETURN 9FT ADLT (ELECTROSURGICAL) ×3
ELECTRODE REM PT RTRN 9FT ADLT (ELECTROSURGICAL) ×1 IMPLANT
GAUZE 4X4 16PLY RFD (DISPOSABLE) IMPLANT
GAUZE SPONGE 4X4 12PLY STRL (GAUZE/BANDAGES/DRESSINGS) ×2 IMPLANT
GLOVE BIO SURGEON STRL SZ 6.5 (GLOVE) ×2 IMPLANT
GLOVE BIO SURGEONS STRL SZ 6.5 (GLOVE) ×1
GLOVE BIOGEL PI IND STRL 7.0 (GLOVE) ×1 IMPLANT
GLOVE BIOGEL PI INDICATOR 7.0 (GLOVE) ×2
GOWN STRL REUS W/TWL 2XL LVL3 (GOWN DISPOSABLE) ×3 IMPLANT
KIT TURNOVER CYSTO (KITS) ×3 IMPLANT
NEEDLE HYPO 22GX1.5 SAFETY (NEEDLE) ×3 IMPLANT
NS IRRIG 500ML POUR BTL (IV SOLUTION) ×3 IMPLANT
PACK BASIN DAY SURGERY FS (CUSTOM PROCEDURE TRAY) ×3 IMPLANT
PAD ABD 8X10 STRL (GAUZE/BANDAGES/DRESSINGS) ×3 IMPLANT
PAD ARMBOARD 7.5X6 YLW CONV (MISCELLANEOUS) IMPLANT
PENCIL BUTTON HOLSTER BLD 10FT (ELECTRODE) ×3 IMPLANT
SPONGE SURGIFOAM ABS GEL 100 (HEMOSTASIS) IMPLANT
SPONGE SURGIFOAM ABS GEL 12-7 (HEMOSTASIS) IMPLANT
SUT CHROMIC 2 0 SH (SUTURE) IMPLANT
SUT CHROMIC 3 0 SH 27 (SUTURE) ×2 IMPLANT
SUT VIC AB 2-0 SH 27 (SUTURE) ×3
SUT VIC AB 2-0 SH 27XBRD (SUTURE) IMPLANT
SUT VIC AB 4-0 P-3 18XBRD (SUTURE) IMPLANT
SUT VIC AB 4-0 P3 18 (SUTURE)
SUT VIC AB 4-0 SH 18 (SUTURE) IMPLANT
SYR CONTROL 10ML LL (SYRINGE) ×3 IMPLANT
TRAY DSU PREP LF (CUSTOM PROCEDURE TRAY) ×3 IMPLANT
TUBE CONNECTING 12'X1/4 (SUCTIONS) ×1
TUBE CONNECTING 12X1/4 (SUCTIONS) ×2 IMPLANT
WATER STERILE IRR 500ML POUR (IV SOLUTION) IMPLANT
YANKAUER SUCT BULB TIP NO VENT (SUCTIONS) ×3 IMPLANT

## 2017-10-13 NOTE — Discharge Instructions (Addendum)
ANORECTAL SURGERY: POST OP INSTRUCTIONS °1. Take your usually prescribed home medications unless otherwise directed. °2. DIET: During the first few hours after surgery sip on some liquids until you are able to urinate.  It is normal to not urinate for several hours after this surgery.  If you feel uncomfortable, please contact the office for instructions.  After you are able to urinate,you may eat, if you feel like it.  Follow a light bland diet the first 24 hours after arrival home, such as soup, liquids, crackers, etc.  Be sure to include lots of fluids daily (6-8 glasses).  Avoid fast food or heavy meals, as your are more likely to get nauseated.  Eat a low fat diet the next few days after surgery.  Limit caffeine intake to 1-2 servings a day. °3. PAIN CONTROL: °a. Pain is best controlled by a usual combination of several different methods TOGETHER: °i. Muscle relaxation: Soak in a warm bath (or Sitz bath) three times a day and after bowel movements.  Continue to do this until all pain is resolved. °ii. Over the counter pain medication °iii. Prescription pain medication °b. Most patients will experience some swelling and discomfort in the anus/rectal area and incisions.  Heat such as warm towels, sitz baths, warm baths, etc to help relax tight/sore spots and speed recovery.  Some people prefer to use ice, especially in the first couple days after surgery, as it may decrease the pain and swelling, or alternate between ice & heat.  Experiment to what works for you.  Swelling and bruising can take several weeks to resolve.  Pain can take even longer to completely resolve. °c. It is helpful to take an over-the-counter pain medication regularly for the first few weeks.  Choose one of the following that works best for you: °i. Naproxen (Aleve, etc)  Two 220mg tabs twice a day °ii. Ibuprofen (Advil, etc) Three 200mg tabs four times a day (every meal & bedtime) °d. A  prescription for pain medication (such as percocet,  oxycodone, hydrocodone, etc) should be given to you upon discharge.  Take your pain medication as prescribed.  °i. If you are having problems/concerns with the prescription medicine (does not control pain, nausea, vomiting, rash, itching, etc), please call us [phone] to see if we need to switch you to a different pain medicine that will work better for you and/or control your side effect better. °ii. If you need a refill on your pain medication, please contact your pharmacy.  They will contact our office to request authorization. Prescriptions will not be filled after 5 pm or on week-ends. °4. KEEP YOUR BOWELS REGULAR and AVOID CONSTIPATION °a. The goal is one to two soft bowel movements a day.  You should at least have a bowel movement every other day. °b. Avoid getting constipated.  Between the surgery and the pain medications, it is common to experience some constipation. This can be very painful after rectal surgery.  Increasing fluid intake and taking a fiber supplement (such as Metamucil, Citrucel, FiberCon, etc) 1-2 times a day regularly will usually help prevent this problem from occurring.  A stool softener like colace is also recommended.  This can be purchased over the counter at your pharmacy.  You can take it up to 3 times a day.  If you do not have a bowel movement after 24 hrs since your surgery, take one does of milk of magnesia.  If you still haven't had a bowel movement 8-12 hours after   that dose, take another dose.  If you don't have a bowel movement 48 hrs after surgery, purchase a Fleets enema from the drug store and administer gently per package instructions.  If you still are having trouble with your bowel movements after that, please call the office for further instructions. °c. If you develop diarrhea or have many loose bowel movements, simplify your diet to bland foods & liquids for a few days.  Stop any stool softeners and decrease your fiber supplement.  Switching to mild  anti-diarrheal medications (Kayopectate, Pepto Bismol) can help.  If this worsens or does not improve, please call us. ° °5. Wound Care °a. Remove your bandages before your first bowel movement or 8 hours after surgery.     °b. Remove any wound packing material at this tim,e as well.  You do not need to repack the wound unless instructed otherwise.  Wear an absorbent pad or soft cotton gauze in your underwear to catch any drainage and help keep the area clean. You should change this every 2-3 hours while awake. °c. Keep the area clean and dry.  Bathe / shower every day, especially after bowel movements.  Keep the area clean by showering / bathing over the incision / wound.   It is okay to soak an open wound to help wash it.  Wet wipes or showers / gentle washing after bowel movements is often less traumatic than regular toilet paper. °d. You may have some styrofoam-like soft packing in the rectum which will come out with the first bowel movement.  °e. You will often notice bleeding with bowel movements.  This should slow down by the end of the first week of surgery °f. Expect some drainage.  This should slow down, too, by the end of the first week of surgery.  Wear an absorbent pad or soft cotton gauze in your underwear until the drainage stops. °g. Do Not sit on a rubber or pillow ring.  This can make you symptoms worse.  You may sit on a soft pillow if needed.  °6. ACTIVITIES as tolerated:   °a. You may resume regular (light) daily activities beginning the next day--such as daily self-care, walking, climbing stairs--gradually increasing activities as tolerated.  If you can walk 30 minutes without difficulty, it is safe to try more intense activity such as jogging, treadmill, bicycling, low-impact aerobics, swimming, etc. °b. Save the most intensive and strenuous activity for last such as sit-ups, heavy lifting, contact sports, etc  Refrain from any heavy lifting or straining until you are off narcotics for pain  control.   °c. You may drive when you are no longer taking prescription pain medication, you can comfortably sit for long periods of time, and you can safely maneuver your car and apply brakes. °d. You may have sexual intercourse when it is comfortable.  °7. FOLLOW UP in our office °a. Please call [facility] at [phone] to set up an appointment to see your surgeon in the office for a follow-up appointment approximately 3-4 weeks after your surgery. °b. Make sure that you call for this appointment the day you arrive home to insure a convenient appointment time. °10. IF YOU HAVE DISABILITY OR FAMILY LEAVE FORMS, BRING THEM TO THE OFFICE FOR PROCESSING.  DO NOT GIVE THEM TO YOUR DOCTOR. ° ° ° ° °WHEN TO CALL US [phone]: °1. Poor pain control °2. Reactions / problems with new medications (rash/itching, nausea, etc)  °3. Fever over 101.5 F (38.5 C) °4.   Inability to urinate 5. Nausea and/or vomiting 6. Worsening swelling or bruising 7. Continued bleeding from incision. 8. Increased pain, redness, or drainage from the incision  The clinic staff is available to answer your questions during regular business hours (8:30am-5pm).  Please dont hesitate to call and ask to speak to one of our nurses for clinical concerns.   A surgeon from [facility] is always on call at the hospitals   If you have a medical emergency, go to the nearest emergency room or call 911.    [facility] [address], [place], [place]  [address] ? MAIN: [phone] ? TOLL FREE: [phone] ? FAX [phone] [email or web address]      Post Anesthesia Home Care Instructions  Activity: Get plenty of rest for the remainder of the day. A responsible individual must stay with you for 24 hours following the procedure.  For the next 24 hours, DO NOT: -Drive a car -Advertising copywriter -Drink alcoholic beverages -Take any medication unless instructed by your  physician -Make any legal decisions or sign important papers.  Meals: Start with liquid foods such as gelatin or soup. Progress to regular foods as tolerated. Avoid greasy, spicy, heavy foods. If nausea and/or vomiting occur, drink only clear liquids until the nausea and/or vomiting subsides. Call your physician if vomiting continues.  Special Instructions/Symptoms: Your throat may feel dry or sore from the anesthesia or the breathing tube placed in your throat during surgery. If this causes discomfort, gargle with warm salt water. The discomfort should disappear within 24 hours.  If you had a scopolamine patch placed behind your ear for the management of post- operative nausea and/or vomiting:  1. The medication in the patch is effective for 72 hours, after which it should be removed.  Wrap patch in a tissue and discard in the trash. Wash hands thoroughly with soap and water. 2. You may remove the patch earlier than 72 hours if you experience unpleasant side effects which may include dry mouth, dizziness or visual disturbances. 3. Avoid touching the patch. Wash your hands with soap and water after contact with the patch.   Information for Discharge Teaching: EXPAREL (bupivacaine liposome injectable suspension)   Your surgeon or anesthesiologist gave you EXPAREL(bupivacaine) to help control your pain after surgery.   EXPAREL is a local anesthetic that provides pain relief by numbing the tissue around the surgical site.  EXPAREL is designed to release pain medication over time and can control pain for up to 72 hours.  Depending on how you respond to EXPAREL, you may require less pain medication during your recovery.  Possible side effects:  Temporary loss of sensation or ability to move in the area where bupivacaine was injected.  Nausea, vomiting, constipation  Rarely, numbness and tingling in your mouth or lips, lightheadedness, or anxiety may occur.  Call your doctor right away if  you think you may be experiencing any of these sensations, or if you have other questions regarding possible side effects.  Follow all other discharge instructions given to you by your surgeon or nurse. Eat a healthy diet and drink plenty of water or other fluids.  If you return to the hospital for any reason within 96 hours following the administration of EXPAREL, it is important for health care providers to know that you have received this anesthetic. A teal colored band has been placed on your arm with the date, time and amount of EXPAREL you have received in order  to alert and inform your health care providers. Please leave this armband in place for the full 96 hours following administration, and then you may remove the band.  Wear bracelet for 96 hours after surgery

## 2017-10-13 NOTE — Transfer of Care (Signed)
Immediate Anesthesia Transfer of Care Note  Patient: [name] [name]  Procedure(s) Performed: Procedure(s) (LRB): SINGLE COLUMN HEMORRHOIDECTOMY (N/A)  Patient Location: PACU  Anesthesia Type: MAC  Level of Consciousness: awake, alert , oriented and patient cooperative  Airway & Oxygen Therapy: Patient Spontanous Breathing and Patient connected to face mask oxygen  Post-op Assessment: Report given to PACU RN and Post -op Vital signs reviewed and stable  Post vital signs: Reviewed and stable  Complications: No apparent anesthesia complications  Last Vitals:  Vitals Value Taken Time  BP 159/93 10/13/2017  9:30 AM  Temp 37.1 C 10/13/2017  9:30 AM  Pulse 80 10/13/2017  9:33 AM  Resp 17 10/13/2017  9:33 AM  SpO2 97 % 10/13/2017  9:33 AM  Vitals shown include unvalidated device data.  Last Pain:  Vitals:   10/13/17 0716  TempSrc:   PainSc: 0-No pain      Patients Stated Pain Goal: 6 (10/13/17 0716)

## 2017-10-13 NOTE — H&P (Signed)
The patient is a 47 year old female who presents with a complaint of Rectal bleeding. 47 year old female who presents to the office for evaluation of rectal bleeding. She underwent a colonoscopy for this approximately 2 years ago. Several polyps were found but no other source was noted. She was seen by Dr. Antonieta Pert here in the office in March 2018 and rubber band ligation was performed. She states that ever since then she has continued to have rectal bleeding on a daily basis. She reports regular bowel habits and denies any straining.   Problem List/Past Medical [name] Levee, MD; 08/17/2017 11:44 AM) PROLAPSED INTERNAL HEMORRHOIDS, GRADE 3 (N27.7)  Past Surgical History [name] Levee, MD; 08/17/2017 11:44 AM) Colon Polyp Removal - Colonoscopy  Diagnostic Studies History [name] Levee, MD; 08/17/2017 11:44 AM) Colonoscopy 1-5 years ago Mammogram within last year Pap Smear 1-5 years ago  Allergies (Tanisha A. Manson Passey, RMA; 08/17/2017 11:32 AM) Amoxicillin *PENICILLINS* Allergies Reconciled  Medication History (Tanisha A. Manson Passey, RMA; 08/17/2017 11:32 AM) Atorvastatin Calcium (10MG  Tablet, Oral) Active. FLUoxetine HCl (40MG  Capsule, Oral) Active. Losartan Potassium (50MG  Tablet, Oral) Active. Topiramate (25MG  Tablet, Oral) Active. Pantoprazole Sodium (40MG  Tablet DR, Oral) Active. LORazepam (1MG  Tablet, Oral) Active. Medications Reconciled  Social History [name] Levee, MD; 08/17/2017 11:44 AM) No alcohol use No caffeine use No drug use Tobacco use Never smoker.  Family History [name] Levee, MD; 08/17/2017 11:44 AM) Diabetes Mellitus Brother, Sister. Heart Disease Sister.  Pregnancy / Birth History [name] Levee, MD; 08/17/2017 11:44 AM) Age at menarche 15 years. Gravida 5 Length (months) of breastfeeding 3-6 Maternal age 60-30 Para 4 Regular periods  Other Problems [name] Levee, MD; 08/17/2017 11:44 AM) Anxiety  Disorder Depression Gastroesophageal Reflux Disease Hemorrhoids High blood pressure Hypercholesterolemia Migraine Headache INTERNAL HEMORRHOID (K64.8) [04/21/2016]:     Review of Systems  General Present- Fatigue. Not Present- Appetite Loss, Chills, Fever, Night Sweats, Weight Gain and Weight Loss. Skin Present- Dryness. Not Present- Change in Wart/Mole, Hives, Jaundice, New Lesions, Non-Healing Wounds, Rash and Ulcer. HEENT Present- Seasonal Allergies and Wears glasses/contact lenses. Not Present- Earache, Hearing Loss, Hoarseness, Nose Bleed, Oral Ulcers, Ringing in the Ears, Sinus Pain, Sore Throat, Visual Disturbances and Yellow Eyes. Breast Not Present- Breast Mass, Breast Pain, Nipple Discharge and Skin Changes. Cardiovascular Not Present- Chest Pain, Difficulty Breathing Lying Down, Leg Cramps, Palpitations, Rapid Heart Rate, Shortness of Breath and Swelling of Extremities. Gastrointestinal Present- Hemorrhoids. Not Present- Abdominal Pain, Bloating, Bloody Stool, Change in Bowel Habits, Chronic diarrhea, Constipation, Difficulty Swallowing, Excessive gas, Gets full quickly at meals, Indigestion, Nausea, Rectal Pain and Vomiting. Female Genitourinary Not Present- Frequency, Nocturia, Painful Urination, Pelvic Pain and Urgency. Musculoskeletal Present- Joint Stiffness. Not Present- Back Pain, Joint Pain, Muscle Pain, Muscle Weakness and Swelling of Extremities. Neurological Present- Numbness and Tingling. Not Present- Decreased Memory, Fainting, Headaches, Seizures, Tremor, Trouble walking and Weakness. Psychiatric Present- Anxiety. Not Present- Bipolar, Change in Sleep Pattern, Depression, Fearful and Frequent crying. Endocrine Not Present- Cold Intolerance, Excessive Hunger, Hair Changes, Heat Intolerance, Hot flashes and New Diabetes. Hematology Not Present- Blood Thinners, Easy Bruising, Excessive bleeding, Gland problems, HIV and Persistent Infections.  BP (!) 153/96  (BP Location: Left Arm, Patient Position: Sitting)   Pulse 72   Temp 98.4 F (36.9 C) (Oral)   Resp 18   Ht 5' (1.524 m)   Wt 54.6 kg   LMP 09/24/2017 (Exact Date)   SpO2 100%   BMI 23.49 kg/m    Physical Exam  General Mental Status-Alert. General Appearance-Consistent with  stated age. Hydration-Well hydrated. Voice-Normal.  Head and Neck Head-normocephalic, atraumatic with no lesions or palpable masses.  Chest and Lung Exam Chest and lung exam reveals -quiet, even and easy respiratory effort with no use of accessory muscles, normal resonance, no flatness or dullness, non-tender and normal tactile fremitus and on auscultation, normal breath sounds, no adventitious sounds and normal vocal resonance. Inspection Chest Wall - Normal. Back - normal.  Cardiovascular Cardiovascular examination reveals -on palpation PMI is normal in location and amplitude, no palpable S3 or S4. Normal cardiac borders., normal heart sounds, regular rate and rhythm with no murmurs, carotid auscultation reveals no bruits and normal pedal pulses bilaterally.  Abdomen Inspection Inspection of the abdomen reveals - No Hernias. Skin - Scar - no surgical scars. Palpation/Percussion Palpation and Percussion of the abdomen reveal - Soft, Non Tender, No Rebound tenderness, No Rigidity (guarding) and No hepatosplenomegaly.  Rectal Anorectal Exam External - skin tag. Internal - normal sphincter tone and internal hemorrhoids(At the 5 o'clock position). Proctoscopic exam-Internal hemorrhoids.  Neurologic Neurologic evaluation reveals -alert and oriented x 3 with no impairment of recent or remote memory. Mental Status-Normal.  Musculoskeletal Normal Exam - Left-Upper Extremity Strength Normal and Lower Extremity Strength Normal. Normal Exam - Right-Upper Extremity Strength Normal, Lower Extremity Weakness.   ANOSCOPY, DIAGNOSTIC ([id number]) [ Hemorrhoids  ] Procedure Other: Procedure: Anoscopy....[name] KitchenMarland KitchenSurgeon: Maisie Fus....[name] KitchenMarland KitchenAfter the risks and benefits were explained, verbal consent was obtained for above procedure. A medical assistant chaperone was present thoroughout the entire procedure. ....[name] KitchenMarland KitchenAnesthesia: none....[name] KitchenMarland KitchenDiagnosis: Rectal bleeding....[name] KitchenMarland KitchenFindings: Grade 3 right posterior internal hemorrhoid. No other internal hemorrhoid disease noted.  Performed: 08/17/2017 11:43 AM    Assessment & Plan  PROLAPSED INTERNAL HEMORRHOIDS, GRADE 3 (G95.6) Impression: 48 year old female who presents to the office with recurrent rectal bleeding. She has failed rectal suppositories as well as rubber band ligation. On exam she has one grade 3 right posterior internal hemorrhoid that seems to be the cause of her bleeding. I have recommended that she undergo a hemorrhoidectomy. We have discussed this in detail including postoperative pain. She would like to proceed with surgery. She would like to do this after Labor Day. We will schedule this at her convenience.

## 2017-10-13 NOTE — Anesthesia Postprocedure Evaluation (Signed)
Anesthesia Post Note  Patient: [name] [name]  Procedure(s) Performed: SINGLE COLUMN HEMORRHOIDECTOMY (N/A )     Patient location during evaluation: PACU Anesthesia Type: MAC Level of consciousness: awake and alert Pain management: pain level controlled Vital Signs Assessment: post-procedure vital signs reviewed and stable Respiratory status: spontaneous breathing, nonlabored ventilation, respiratory function stable and patient connected to nasal cannula oxygen Cardiovascular status: stable and blood pressure returned to baseline Postop Assessment: no apparent nausea or vomiting Anesthetic complications: no    Last Vitals:  Vitals:   10/13/17 1015 10/13/17 1030  BP: (!) 157/95 (!) 164/93  Pulse: 78 72  Resp: 12 14  Temp:    SpO2: 97% 97%    Last Pain:  Vitals:   10/13/17 1030  TempSrc:   PainSc: 0-No pain                 Tiajuana Amass

## 2017-10-13 NOTE — Op Note (Signed)
10/13/2017  9:25 AM  PATIENT:  [name] [name]  47 y.o. female  Patient Care Team: Bartholome Bill, MD as PCP - General (Family Medicine)  PRE-OPERATIVE DIAGNOSIS:  grade 3 internal hemorrhoid  bleeding  POST-OPERATIVE DIAGNOSIS:  Grade 3 internal hemorrhoid  PROCEDURE:  SINGLE COLUMN HEMORRHOIDECTOMY   Surgeon(s): Leighton Ruff, MD  ASSISTANT: none   ANESTHESIA:   local and MAC  SPECIMEN:  Source of Specimen:  R posterior hemorrhoid  DISPOSITION OF SPECIMEN:  PATHOLOGY  COUNTS:  YES  PLAN OF CARE: Discharge to home after PACU  PATIENT DISPOSITION:  PACU - hemodynamically stable.  INDICATION: 47 y.o. F with rectal bleeding, colonoscopy was neg, failed rubber band ligation   OR FINDINGS: Grade 3 RP internal hemorrhoid  DESCRIPTION: the patient was identified in the preoperative holding area and taken to the OR where they were laid on the operating room table.  MAC anesthesia was induced without difficulty. The patient was then positioned in prone jackknife position with buttocks gently taped apart.  The patient was then prepped and draped in usual sterile fashion.  SCDs were noted to be in place prior to the initiation of anesthesia. A surgical timeout was performed indicating the correct patient, procedure, positioning and need for preoperative antibiotics.  A rectal block was performed using Marcaine with epinephrine mixed with Experel.    I began with a digital rectal exam.  There were no masses.  Sphincter tone was good.  I then placed a Hill-Ferguson anoscope into the anal canal and evaluated this completely.  There was a moderately inflamed internal hemorrhoid in the RP location.  The perianal incision was made with a 15 blade scalpel.  Dissection was carried down to the level of the sphincter complex.  I used Metzenbaum scissors to dissect the sphincter complex from the hemorrhoidal tissue above.  Once a plane was developed, I divided the anoderm down to the distal  rectum.  This was then sent to pathology for further examination.  I used a 2-0 chromic suture to close the anoderm to the level of the dentate line.  I then used a 3-0 chromic suture to close the tissue distal to this.  Hemostasis was good.  She tolerated this well and was awakened from anesthesia and sent to the postanesthesia care unit in stable condition.  All counts were correct per operating room staff.    I have reviewed the [place] for this patient.  There are no active prescriptions noted for this patient.

## 2017-10-14 ENCOUNTER — Encounter (HOSPITAL_BASED_OUTPATIENT_CLINIC_OR_DEPARTMENT_OTHER): Payer: Self-pay | Admitting: General Surgery

## 2017-10-15 LAB — POCT PREGNANCY, URINE: Preg Test, Ur: NEGATIVE

## 2018-04-05 ENCOUNTER — Encounter: Payer: Self-pay | Admitting: Neurology

## 2018-04-05 ENCOUNTER — Ambulatory Visit: Payer: BLUE CROSS/BLUE SHIELD | Admitting: Neurology

## 2018-04-05 VITALS — BP 158/94 | HR 73 | Ht 60.0 in | Wt 118.0 lb

## 2018-04-05 DIAGNOSIS — F411 Generalized anxiety disorder: Secondary | ICD-10-CM

## 2018-04-05 DIAGNOSIS — G43009 Migraine without aura, not intractable, without status migrainosus: Secondary | ICD-10-CM

## 2018-04-05 MED ORDER — METOCLOPRAMIDE HCL 10 MG PO TABS: 10.0000 mg | ORAL_TABLET | Freq: Three times a day (TID) | ORAL | 6 refills | Status: AC | PRN

## 2018-04-05 NOTE — Patient Instructions (Addendum)
Metoclopramide tablets What is this medicine? METOCLOPRAMIDE (met oh kloe PRA mide) is used to treat the symptoms of gastroesophageal reflux disease (GERD) like heartburn. It is also used to treat people with slow emptying of the stomach and intestinal tract. This medicine may be used for other purposes; ask your health care provider or pharmacist if you have questions. COMMON BRAND NAME(S): Reglan What should I tell my health care provider before I take this medicine? They need to know if you have any of these conditions: -breast cancer -depression -diabetes -heart failure -high blood pressure -kidney disease -liver disease -Parkinson's disease or a movement disorder -pheochromocytoma -seizures -stomach obstruction, bleeding, or perforation -an unusual or allergic reaction to metoclopramide, procainamide, sulfites, other medicines, foods, dyes, or preservatives -pregnant or trying to get pregnant -breast-feeding How should I use this medicine? Take this medicine by mouth with a glass of water. Follow the directions on the prescription label. Take this medicine on an empty stomach, about 30 minutes before eating. Take your doses at regular intervals. Do not take your medicine more often than directed. Do not stop taking except on the advice of your doctor or health care professional. A special MedGuide will be given to you by the pharmacist with each prescription and refill. Be sure to read this information carefully each time. Talk to your pediatrician regarding the use of this medicine in children. Special care may be needed. Overdosage: If you think you have taken too much of this medicine contact a poison control center or emergency room at once. NOTE: This medicine is only for you. Do not share this medicine with others. What if I miss a dose? If you miss a dose, take it as soon as you can. If it is almost time for your next dose, take only that dose. Do not take double or extra  doses. What may interact with this medicine? -acetaminophen -cyclosporine -digoxin -medicines for blood pressure -medicines for diabetes, including insulin -medicines for hay fever and other allergies -medicines for depression, especially a Monoamine Oxidase Inhibitor (MAOI) -medicines for Parkinson's disease, like levodopa -medicines for sleep or for pain -quinidine -tetracycline This list may not describe all possible interactions. Give your health care provider a list of all the medicines, herbs, non-prescription drugs, or dietary supplements you use. Also tell them if you smoke, drink alcohol, or use illegal drugs. Some items may interact with your medicine. What should I watch for while using this medicine? It may take a few weeks for your stomach condition to start to get better. However, do not take this medicine for longer than 12 weeks. The longer you take this medicine, and the more you take it, the greater your chances are of developing serious side effects. If you are an elderly patient, a female patient, or you have diabetes, you may be at an increased risk for side effects from this medicine. Contact your doctor immediately if you start having movements you cannot control such as lip smacking, rapid movements of the tongue, involuntary or uncontrollable movements of the eyes, head, arms and legs, or muscle twitches and spasms. Patients and their families should watch out for worsening depression or thoughts of suicide. Also watch out for any sudden or severe changes in feelings such as feeling anxious, agitated, panicky, irritable, hostile, aggressive, impulsive, severely restless, overly excited and hyperactive, or not being able to sleep. If this happens, especially at the beginning of treatment or after a change in dose, call your doctor. Do not treat  yourself for high fever. Ask your doctor or health care professional for advice. You may get drowsy or dizzy. Do not drive, use  machinery, or do anything that needs mental alertness until you know how this drug affects you. Do not stand or sit up quickly, especially if you are an older patient. This reduces the risk of dizzy or fainting spells. Alcohol can make you more drowsy and dizzy. Avoid alcoholic drinks. What side effects may I notice from receiving this medicine? Side effects that you should report to your doctor or health care professional as soon as possible: -allergic reactions like skin rash, itching or hives, swelling of the face, lips, or tongue -abnormal production of milk in females -breast enlargement in both males and females -change in the way you walk -difficulty moving, speaking or swallowing -drooling, lip smacking, or rapid movements of the tongue -excessive sweating -fever -involuntary or uncontrollable movements of the eyes, head, arms and legs -irregular heartbeat or palpitations -muscle twitches and spasms -unusually weak or tired Side effects that usually do not require medical attention (report to your doctor or health care professional if they continue or are bothersome): -change in sex drive or performance -depressed mood -diarrhea -difficulty sleeping -headache -menstrual changes -restless or nervous This list may not describe all possible side effects. Call your doctor for medical advice about side effects. You may report side effects to FDA at 1-800-FDA-1088. Where should I keep my medicine? Keep out of the reach of children. Store at room temperature between 20 and 25 degrees C (68 and 77 degrees F). Protect from light. Keep container tightly closed. Throw away any unused medicine after the expiration date. NOTE: This sheet is a summary. It may not cover all possible information. If you have questions about this medicine, talk to your doctor, pharmacist, or health care provider.  2019 Elsevier/Gold Standard (2015-11-06 15:13:45)  Generalized Anxiety Disorder,  Adult Generalized anxiety disorder (GAD) is a mental health disorder. People with this condition constantly worry about everyday events. Unlike normal anxiety, worry related to GAD is not triggered by a specific event. These worries also do not fade or get better with time. GAD interferes with life functions, including relationships, work, and school. GAD can vary from mild to severe. People with severe GAD can have intense waves of anxiety with physical symptoms (panic attacks). What are the causes? The exact cause of GAD is not known. What increases the risk? This condition is more likely to develop in:  Women.  People who have a family history of anxiety disorders.  People who are very shy.  People who experience very stressful life events, such as the death of a loved one.  People who have a very stressful family environment. What are the signs or symptoms? People with GAD often worry excessively about many things in their lives, such as their health and family. They may also be overly concerned about:  Doing well at work.  Being on time.  Natural disasters.  Friendships. Physical symptoms of GAD include:  Fatigue.  Muscle tension or having muscle twitches.  Trembling or feeling shaky.  Being easily startled.  Feeling like your heart is pounding or racing.  Feeling out of breath or like you cannot take a deep breath.  Having trouble falling asleep or staying asleep.  Sweating.  Nausea, diarrhea, or irritable bowel syndrome (IBS).  Headaches.  Trouble concentrating or remembering facts.  Restlessness.  Irritability. How is this diagnosed? Your health care provider can diagnose GAD  based on your symptoms and medical history. You will also have a physical exam. The health care provider will ask specific questions about your symptoms, including how severe they are, when they started, and if they come and go. Your health care provider may ask you about your use  of alcohol or drugs, including prescription medicines. Your health care provider may refer you to a mental health specialist for further evaluation. Your health care provider will do a thorough examination and may perform additional tests to rule out other possible causes of your symptoms. To be diagnosed with GAD, a person must have anxiety that:  Is out of his or her control.  Affects several different aspects of his or her life, such as work and relationships.  Causes distress that makes him or her unable to take part in normal activities.  Includes at least three physical symptoms of GAD, such as restlessness, fatigue, trouble concentrating, irritability, muscle tension, or sleep problems. Before your health care provider can confirm a diagnosis of GAD, these symptoms must be present more days than they are not, and they must last for six months or longer. How is this treated? The following therapies are usually used to treat GAD:  Medicine. Antidepressant medicine is usually prescribed for long-term daily control. Antianxiety medicines may be added in severe cases, especially when panic attacks occur.  Talk therapy (psychotherapy). Certain types of talk therapy can be helpful in treating GAD by providing support, education, and guidance. Options include: ? Cognitive behavioral therapy (CBT). People learn coping skills and techniques to ease their anxiety. They learn to identify unrealistic or negative thoughts and behaviors and to replace them with positive ones. ? Acceptance and commitment therapy (ACT). This treatment teaches people how to be mindful as a way to cope with unwanted thoughts and feelings. ? Biofeedback. This process trains you to manage your body's response (physiological response) through breathing techniques and relaxation methods. You will work with a therapist while machines are used to monitor your physical symptoms.  Stress management techniques. These include yoga,  meditation, and exercise. A mental health specialist can help determine which treatment is best for you. Some people see improvement with one type of therapy. However, other people require a combination of therapies. Follow these instructions at home:  Take over-the-counter and prescription medicines only as told by your health care provider.  Try to maintain a normal routine.  Try to anticipate stressful situations and allow extra time to manage them.  Practice any stress management or self-calming techniques as taught by your health care provider.  Do not punish yourself for setbacks or for not making progress.  Try to recognize your accomplishments, even if they are small.  Keep all follow-up visits as told by your health care provider. This is important. Contact a health care provider if:  Your symptoms do not get better.  Your symptoms get worse.  You have signs of depression, such as: ? A persistently sad, cranky, or irritable mood. ? Loss of enjoyment in activities that used to bring you joy. ? Change in weight or eating. ? Changes in sleeping habits. ? Avoiding friends or family members. ? Loss of energy for normal tasks. ? Feelings of guilt or worthlessness. Get help right away if:  You have serious thoughts about hurting yourself or others. If you ever feel like you may hurt yourself or others, or have thoughts about taking your own life, get help right away. You can go to your nearest emergency  department or call:  Your local emergency services (911 in the [place]  A suicide crisis helpline, such as the [facility] at 367-274-1339. This is open 24 hours a day. Summary  Generalized anxiety disorder (GAD) is a mental health disorder that involves worry that is not triggered by a specific event.  People with GAD often worry excessively about many things in their lives, such as their health and family.  GAD may cause physical symptoms such  as restlessness, trouble concentrating, sleep problems, frequent sweating, nausea, diarrhea, headaches, and trembling or muscle twitching.  A mental health specialist can help determine which treatment is best for you. Some people see improvement with one type of therapy. However, other people require a combination of therapies. This information is not intended to replace advice given to you by your health care provider. Make sure you discuss any questions you have with your health care provider. Document Released: 05/16/2012 Document Revised: 12/10/2015 Document Reviewed: 12/10/2015 Elsevier Interactive Patient Education  2019 [employer].

## 2018-04-05 NOTE — Addendum Note (Signed)
Addended by: Naomie Dean B on: 04/05/2018 11:50 AM   Modules accepted: Orders

## 2018-04-05 NOTE — Progress Notes (Addendum)
[name]    Provider:  Dr [name] Referring Provider: Verlon Au, MD Primary Care Physician:  Verlon Au, MD  CC: Migraines  Interval history 04/05/2018: Patient stopped her topiramate on her own bc she was feeling better but now returns for worsening migraines. When she gets anxious and thinking about stuff and people upset her she starts hurting on the let side. Lately she has more headaches. Alleve/advil helps. She is a poor historian, she can;t tell me how many headache days she has even though I keep asking, says 3 then 5. 5 headache days a month, she has anxiety on Prozac, she had a panic attack. She keeps perseverating on her anxiety, she has had panic attacks, she is worried about the corona virus.   meds tried: gabapentin(did not tolerate), Nortriptyline(tried 1 pill made her too tired), topiramate  Interval history 03/02/2017: She stopped her topiramate on her own without calling us. Will give her a triptan for acute use. She is intolerant of many medications. No migraines this month. Excedrin helps. We discussed acute management.   Interval history 03/02/2017: Patient here for follow up of headaches likely migraine. She has not tolerates multiple medications well.  She has a past medical history of anxiety, depression, dysrhythmia, gestational diabetes, high cholesterol. Headaches were described to the left side of the head and electric and vibrating feeling like something is crawling in her head like an insect several times a week. This is in the setting of a lot of anxiety and headaches are worse when upset or anxious. The headache in the left radiates from above the eye to the left occipital area pain in the neck, she has sound sensitivity but not light sensitivity, has some nausea.  meds tried: gabapentin(did not tolerate), Nortriptyline(tried 1 pill made her too tired),   Interval history 09/01/2016: Patient is here for follow-up of  headaches, likely migraine. She has not tolerated multiple medications in the last follow-up she was tried on topiramate. She has a past medical history of anxiety, depression, dysrhythmia, gestational diabetes, high cholesterol. Headaches were described to the left side of the head and electric and vibrating feeling like something is crawling in her head like an insect several times a week. This is in the setting of a lot of anxiety and headaches are worse when upset or anxious. The headache in the left radiates from above the eye to the left occipital area pain in the neck, she has sound sensitivity but not light sensitivity, has some nausea. She feels the Topiramate helps. The light bothers her. She just went up to .  When she cries or she is stressed out the left side of her head looks pale. No medication overuse. She has 1-2 headaches a month. They can last 1-2 days. Alleve/advil helps. 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 She has some tingling in the toes.   meds tried: gabapentin(did not tolerate), Nortriptyline(tried 1 pill made her too tired),   Update 12/03/15: [name], NP [name] is a 48 year old female with a history of headache/neuralgia. She returns today for follow-up. She reports that she tried nortriptyline for 1 night however it made her too sleepy the next day. She states that she has to work and the medication made her so drowsy that she cannot drive. She reports that she has at least one headache a week. The headache normally occurs on the left side of the head. She describes it as a crawling  sensation withsometimes shooting pain. She states that she normally can take Aleve and lay down and the headache will resolve. She states her headaches have been tolerable. She denies any new symptoms. Denies any numbness or tingling in the upper or lower extremities. She is also tried gabapentin in the past but was unable to tolerate this due to drowsiness. She  returns today for an evaluation.  HPI  06/2015 Ahern:[name] H Nguyenis a 48 y.o.femalehere as a referral from Dr. Doree Fudge headache. PMHx depression, anxiety. She has a headache on the left side of the head. Also on the right side but not as much as the left. Started 2 months ago. She has the headache multiple times a week. She also has memory problems. The headache feels electric and vibrating like something crawling on her head like an insect. The headaches are a few times a week. It has gotten better on and off. She describes a lot of anxiety, headaches are worse when she is upset or anxious. Headache comes and goes. If she feels sad, she feels like something in her head. The pain has been mild in the last week. But it can be severe. She has pain on the right at one point (point to the right upper fronto-parietal junction) an dit hurts when she pushes down on that spot. The headache on the left radiates fronm above the eye to the left occipital rea with pain in the neck. She has sound sensitivity but not light sensitivity. Has some nausea. But no vomiting. No other focal neurologic deficits. Reviewed images of the brain with patient including benign cyst.  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  Ventricular size and CSF spaces normal. There is calcification in the right basal ganglia. No evidence for acute infarct, hemorrhage, or mass lesion. No extra-axial fluid collections or midline shift. Calvarium intact. No fluid in the sinuses visualized.  IMPRESSION: No acute or significant findings.   CT of the head 03/1009(personally reviewed images and agree with the following): Comparison: None  Findings: Ventricular size and CSF spaces normal. There is calcification in the right basal ganglia. No evidence for acute infarct, hemorrhage, or mass lesion. No extra-axial fluid  collections or midline shift. Calvarium intact. No fluid in the sinuses visualized.  IMPRESSION: No acute or significant findings.   Social History   Socioeconomic History  . Marital status: Married    Spouse name: Einar Grad  . Number of children: 3  . Years of education: 25  . Highest education level: Not on file  Occupational History  . Occupation: [employer]  Social Needs  . Financial resource strain: Not on file  . Food insecurity:    Worry: Not on file    Inability: Not on file  . Transportation needs:    Medical: Not on file    Non-medical: Not on file  Tobacco Use  . Smoking status: Never Smoker  . Smokeless tobacco: Never Used  Substance and Sexual Activity  . Alcohol use: No  . Drug use: No  . Sexual activity: Yes    Birth control/protection: Surgical    Comment: Essure  Lifestyle  . Physical activity:    Days per week: Not on file    Minutes per session: Not on file  . Stress: Not on file  Relationships  . Social connections:    Talks on phone: Not on file  Gets together: Not on file    Attends religious service: Not on file    Active member of club or organization: Not on file    Attends meetings of clubs or organizations: Not on file    Relationship status: Not on file  . Intimate partner violence:    Fear of current or ex partner: Not on file    Emotionally abused: Not on file    Physically abused: Not on file    Forced sexual activity: Not on file  Other Topics Concern  . Not on file  Social History Narrative   Lives at home w/ her husband and children   Right-handed   Caffeine: coffee sometimes    Family History  Problem Relation Age of Onset  . Cancer Mother        brain  . Hypertension Mother   . Hypertension Father   . Anxiety disorder Sister   . Depression Sister   . Hypertension Sister   . Diabetes Brother   . Heart attack Brother   . Migraines Neg Hx     Past Medical History:  Diagnosis Date  . Anxiety   .  Anxiety attack   . Carpal tunnel syndrome    Bilateral  . Depression   . Dysrhythmia    palpitations with anxiety attacks  . 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 Fatigue   . GERD (gastroesophageal reflux disease)   . Gestational diabetes   . Gestational diabetes requiring insulin 02/20/2012  . Hemorrhoids   . History of colon polyps   . History of gout   . History of rectal bleeding   . Hypercholesterolemia    takes meds when not pregnant  . Hypertension   . Migraines   . No pertinent past medical history   . Normal pregnancy, repeat 09/15/2010  . Numbness and tingling of both feet   . Prolapsed internal hemorrhoids, grade 3   . Seasonal allergies   . SVD (spontaneous vaginal delivery) 09/15/2010    Past Surgical History:  Procedure Laterality Date  . CARPAL TUNNEL RELEASE Right 10/23/2016  . CARPAL TUNNEL RELEASE Left 2016  . COLONOSCOPY W/ POLYPECTOMY    . ESSURE TUBAL LIGATION    . HEMORRHOID BANDING  04/2016  . HEMORRHOID SURGERY N/A 10/13/2017   Procedure: SINGLE COLUMN HEMORRHOIDECTOMY;  Surgeon: Romie Leveehomas, Alicia, MD;  [facility];  Service: General;  Laterality: N/A;    Current Outpatient Medications  Medication Sig Dispense Refill  . atorvastatin (LIPITOR) 10 MG tablet Take 10 mg by mouth daily at 6 PM.     . fexofenadine (ALLEGRA) 180 MG tablet Take 180 mg by mouth daily as needed for allergies or rhinitis.    [name] Kitchen. FLUoxetine (PROZAC) 40 MG capsule Take 40 mg by mouth daily.    [name] Kitchen. losartan (COZAAR) 50 MG tablet Take 50 mg by mouth every evening.     . metFORMIN (GLUCOPHAGE) 500 MG tablet Take 500 mg by mouth daily with breakfast.    . pantoprazole (PROTONIX) 20 MG tablet Take 1 tablet (20 mg total) by mouth daily. (Patient taking differently: Take 20 mg by mouth daily as needed. ) 30 tablet 0  . rizatriptan (MAXALT) 10 MG tablet Take 1 tablet (10 mg total) by mouth as needed for migraine. May repeat in 2 hours. Maximum twice in one  day. (Patient not taking: Reported on 04/05/2018) 10 tablet 11   No current facility-administered medications for this visit.  Allergies as of 04/05/2018 - Review Complete 04/05/2018  Allergen Reaction Noted  . Amoxicillin Rash 08/21/2010  . Doxycycline Nausea And Vomiting 07/02/2016  . Penicillins Rash 09/15/2010    Vitals: BP (!) 158/94 (BP Location: Left Arm, Patient Position: Sitting)   Pulse 73   Ht 5' (1.524 m)   Wt 118 lb (53.5 kg)   BMI 23.05 kg/m  Last Weight:  Wt Readings from Last 1 Encounters:  04/05/18 118 lb (53.5 kg)   Last Height:   Ht Readings from Last 1 Encounters:  04/05/18 5' (1.524 m)   Physical exam: Exam: Gen: NAD, conversant, well nourised, obese, well groomed                     CV: RRR, no MRG. No Carotid Bruits. No peripheral edema, warm, nontender Eyes: Conjunctivae clear without exudates or hemorrhage  Neuro: Detailed Neurologic Exam  Speech:    Speech is normal; fluent and spontaneous with normal comprehension.  Cognition:    The patient is oriented to person, place, and time;     recent and remote memory intact;     language fluent;     normal attention, concentration,     fund of knowledge Cranial Nerves:    The pupils are equal, round, and reactive to light. The fundi are normal and spontaneous venous pulsations are present. Visual fields are full to finger confrontation. Extraocular movements are intact. Trigeminal sensation is intact and the muscles of mastication are normal. The face is symmetric. The palate elevates in the midline. Hearing intact. Voice is normal. Shoulder shrug is normal. The tongue has normal motion without fasciculations.   Coordination:    Normal finger to nose and heel to shin. Normal rapid alternating movements.   Gait:    Heel-toe and tandem gait are normal.   Motor Observation:    No asymmetry, no atrophy, and no involuntary movements noted. Tone:    Normal muscle tone.    Posture:    Posture is  normal. normal erect    Strength:    Strength is V/V in the upper and lower limbs.      Sensation: intact to LT     Reflex Exam:  DTR's:    Deep tendon reflexes in the upper and lower extremities are normal bilaterally.   Toes:    The toes are downgoing bilaterally.   Clonus:    Clonus is absent.     Assessment/Plan:  48 year old with headaches, likely migraines. Discussed acute management. Can take excedrin, no more than 10x a month to avoid rebound. Also may take Maxalt. Will not start preventative, she only has maximum headaches 3-5 days a month and has them with anxiety and she is excessively worried, excessively anxious.  Anxiety: Needs therapist and follow up for medication management. Her biggest issue today is anxiety. She has a few headache days a month treated well with ibuprofen or excedrin sometimes she goes weeks or months without headache. It is more her anxiety she is discussing today.   meds tried: gabapentin(did not tolerate), Nortriptyline(tried 1 pill made her too tired), Topiramate (made her tired), prozac(been on  Along time misses it several times a week), several triptans  Discussed compliance today.  Acutely: She has not had good luck with triptans but has not used them as prescribed, she does not take them right away. She is intolerant of many meds. Can try Reglan as needed.   Discussed: To prevent or relieve headaches,  try the following: Cool Compress. Lie down and place a cool compress on your head.  Avoid headache triggers. If certain foods or odors seem to have triggered your migraines in the past, avoid them. A headache diary might help you identify triggers.  Include physical activity in your daily routine. Try a daily walk or other moderate aerobic exercise.  Manage stress. Find healthy ways to cope with the stressors, such as delegating tasks on your to-do list.  Practice relaxation techniques. Try deep breathing, yoga, massage and visualization.    Eat regularly. Eating regularly scheduled meals and maintaining a healthy diet might help prevent headaches. Also, drink plenty of fluids.  Follow a regular sleep schedule. Sleep deprivation might contribute to headaches Consider biofeedback. With this mind-body technique, you learn to control certain bodily functions - such as muscle tension, heart rate and blood pressure - to prevent headaches or reduce headache pain.    Proceed to emergency room if you experience new or worsening symptoms or symptoms do not resolve, if you have new neurologic symptoms or if headache is severe, or for any concerning symptom.   Provided education and documentation from American headache Society toolbox including articles on: chronic migraine medication overuse headache, chronic migraines, prevention of migraines, behavioral and other nonpharmacologic treatments for headache.  Naomie Dean, MD  [facility] [address] [place], [place] [address]  Phone [phone] Fax [phone]  A total of 15 minutes was spent face-to-face with this patient. Over half this time was spent on counseling patient on the  1. Generalized anxiety disorder   2. Migraine without aura and without status migrainosus, not intractable     and different diagnostic and therapeutic options available.

## 2018-04-11 ENCOUNTER — Ambulatory Visit: Payer: BLUE CROSS/BLUE SHIELD | Admitting: Neurology

## 2019-05-28 ENCOUNTER — Ambulatory Visit: Payer: Self-pay | Attending: Internal Medicine

## 2019-05-28 DIAGNOSIS — Z23 Encounter for immunization: Secondary | ICD-10-CM

## 2019-05-28 NOTE — Progress Notes (Signed)
   Covid-19 Vaccination [facility]  Name:  SANJA ELIZARDO    MRN: [id number] DOB: 08-25-1970  05/28/2019  Ms. Bronkema was observed post Covid-19 immunization for 15 minutes without incident. She was provided with Vaccine Information Sheet and instruction to access the V-Safe system.   Ms. Bonnell was instructed to call 911 with any severe reactions post vaccine: [name] Kitchen Difficulty breathing  . Swelling of face and throat  . A fast heartbeat  . A bad rash all over body  . Dizziness and weakness   Immunizations Administered    Name Date Dose VIS Date Route   Moderna COVID-19 Vaccine 05/28/2019  1:25 PM 0.5 mL 01/2019 Intramuscular   Manufacturer: Moderna   LotMadilyn Hook   NDC: 88891-694-50

## 2019-06-25 ENCOUNTER — Ambulatory Visit: Payer: Medicaid Other | Attending: Family Medicine
